# Patient Record
Sex: Female | Born: 1940 | Race: White | Hispanic: No | Smoking: Never smoker
Health system: Southern US, Community
[De-identification: ages and names within clinical notes are randomized; demographics above are authoritative.]

## PROBLEM LIST (undated history)

## (undated) DIAGNOSIS — T8859XA Other complications of anesthesia, initial encounter: Secondary | ICD-10-CM

## (undated) DIAGNOSIS — E7800 Pure hypercholesterolemia, unspecified: Secondary | ICD-10-CM

## (undated) DIAGNOSIS — M4800 Spinal stenosis, site unspecified: Secondary | ICD-10-CM

## (undated) DIAGNOSIS — M25561 Pain in right knee: Secondary | ICD-10-CM

## (undated) DIAGNOSIS — F329 Major depressive disorder, single episode, unspecified: Secondary | ICD-10-CM

## (undated) DIAGNOSIS — F32A Depression, unspecified: Secondary | ICD-10-CM

## (undated) DIAGNOSIS — L570 Actinic keratosis: Secondary | ICD-10-CM

## (undated) DIAGNOSIS — M1990 Unspecified osteoarthritis, unspecified site: Secondary | ICD-10-CM

## (undated) DIAGNOSIS — Z9189 Other specified personal risk factors, not elsewhere classified: Secondary | ICD-10-CM

## (undated) DIAGNOSIS — K219 Gastro-esophageal reflux disease without esophagitis: Secondary | ICD-10-CM

## (undated) HISTORY — DX: Actinic keratosis: L57.0

## (undated) HISTORY — PX: BREAST CYST ASPIRATION: SHX578

---
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
## 1978-09-25 HISTORY — PX: ABDOMINAL HYSTERECTOMY: SHX81

## 2016-10-03 DIAGNOSIS — E559 Vitamin D deficiency, unspecified: Secondary | ICD-10-CM | POA: Insufficient documentation

## 2016-10-13 DIAGNOSIS — M8589 Other specified disorders of bone density and structure, multiple sites: Secondary | ICD-10-CM | POA: Insufficient documentation

## 2016-10-16 ENCOUNTER — Other Ambulatory Visit: Payer: Self-pay | Admitting: Internal Medicine

## 2016-10-16 DIAGNOSIS — Z1231 Encounter for screening mammogram for malignant neoplasm of breast: Secondary | ICD-10-CM

## 2016-11-10 ENCOUNTER — Ambulatory Visit
Admission: RE | Admit: 2016-11-10 | Discharge: 2016-11-10 | Disposition: A | Discharge status: Home / Self Care | Payer: Medicare Other | Source: Ambulatory Visit | Attending: Internal Medicine | Admitting: Internal Medicine

## 2016-11-10 DIAGNOSIS — Z1231 Encounter for screening mammogram for malignant neoplasm of breast: Secondary | ICD-10-CM | POA: Insufficient documentation

## 2016-11-22 ENCOUNTER — Other Ambulatory Visit: Payer: Self-pay | Admitting: *Deleted

## 2016-11-22 ENCOUNTER — Inpatient Hospital Stay
Admission: RE | Admit: 2016-11-22 | Discharge: 2016-11-22 | Disposition: A | Discharge status: Home / Self Care | Payer: Self-pay | Source: Ambulatory Visit | Attending: *Deleted | Admitting: *Deleted

## 2016-11-22 DIAGNOSIS — Z9289 Personal history of other medical treatment: Secondary | ICD-10-CM

## 2018-06-24 ENCOUNTER — Other Ambulatory Visit: Payer: Self-pay | Admitting: Orthopedic Surgery

## 2018-06-24 DIAGNOSIS — G8929 Other chronic pain: Secondary | ICD-10-CM

## 2018-06-24 DIAGNOSIS — M5441 Lumbago with sciatica, right side: Principal | ICD-10-CM

## 2018-07-12 ENCOUNTER — Ambulatory Visit
Admission: RE | Admit: 2018-07-12 | Discharge: 2018-07-12 | Disposition: A | Discharge status: Home / Self Care | Payer: Medicare Other | Source: Ambulatory Visit | Attending: Orthopedic Surgery | Admitting: Orthopedic Surgery

## 2018-07-12 DIAGNOSIS — G8929 Other chronic pain: Secondary | ICD-10-CM

## 2018-07-12 DIAGNOSIS — M899 Disorder of bone, unspecified: Secondary | ICD-10-CM | POA: Insufficient documentation

## 2018-07-12 DIAGNOSIS — M48061 Spinal stenosis, lumbar region without neurogenic claudication: Secondary | ICD-10-CM | POA: Diagnosis not present

## 2018-07-12 DIAGNOSIS — M5441 Lumbago with sciatica, right side: Secondary | ICD-10-CM | POA: Diagnosis not present

## 2018-07-12 DIAGNOSIS — M4316 Spondylolisthesis, lumbar region: Secondary | ICD-10-CM | POA: Diagnosis not present

## 2018-07-25 ENCOUNTER — Other Ambulatory Visit: Payer: Self-pay | Admitting: Orthopedic Surgery

## 2018-07-25 DIAGNOSIS — G8929 Other chronic pain: Secondary | ICD-10-CM

## 2018-07-25 DIAGNOSIS — M5441 Lumbago with sciatica, right side: Principal | ICD-10-CM

## 2018-12-11 ENCOUNTER — Ambulatory Visit
Admission: RE | Admit: 2018-12-11 | Discharge: 2018-12-11 | Disposition: A | Discharge status: Home / Self Care | Payer: Medicare Other | Source: Ambulatory Visit | Attending: Orthopedic Surgery | Admitting: Orthopedic Surgery

## 2018-12-14 ENCOUNTER — Emergency Department: Payer: Medicare Other

## 2018-12-14 ENCOUNTER — Other Ambulatory Visit: Payer: Self-pay

## 2018-12-14 ENCOUNTER — Encounter: Payer: Self-pay | Admitting: Emergency Medicine

## 2018-12-14 ENCOUNTER — Emergency Department
Admission: EM | Admit: 2018-12-14 | Discharge: 2018-12-14 | Disposition: A | Discharge status: Home / Self Care | Payer: Medicare Other | Attending: Emergency Medicine | Admitting: Emergency Medicine

## 2018-12-14 DIAGNOSIS — Y9301 Activity, walking, marching and hiking: Secondary | ICD-10-CM | POA: Diagnosis not present

## 2018-12-14 DIAGNOSIS — Z7982 Long term (current) use of aspirin: Secondary | ICD-10-CM | POA: Insufficient documentation

## 2018-12-14 DIAGNOSIS — R42 Dizziness and giddiness: Secondary | ICD-10-CM | POA: Diagnosis not present

## 2018-12-14 DIAGNOSIS — S0083XA Contusion of other part of head, initial encounter: Secondary | ICD-10-CM

## 2018-12-14 DIAGNOSIS — Y998 Other external cause status: Secondary | ICD-10-CM | POA: Diagnosis not present

## 2018-12-14 DIAGNOSIS — W01198A Fall on same level from slipping, tripping and stumbling with subsequent striking against other object, initial encounter: Secondary | ICD-10-CM | POA: Insufficient documentation

## 2018-12-14 DIAGNOSIS — S0990XA Unspecified injury of head, initial encounter: Secondary | ICD-10-CM | POA: Diagnosis present

## 2018-12-14 DIAGNOSIS — Z79899 Other long term (current) drug therapy: Secondary | ICD-10-CM | POA: Diagnosis not present

## 2018-12-14 DIAGNOSIS — Y92480 Sidewalk as the place of occurrence of the external cause: Secondary | ICD-10-CM | POA: Diagnosis not present

## 2018-12-14 DIAGNOSIS — W19XXXA Unspecified fall, initial encounter: Secondary | ICD-10-CM

## 2018-12-14 NOTE — Discharge Instructions (Addendum)
Follow-up with Dr. Caryl Comes if you have any continued concerns.  Return emergency department worsening.

## 2018-12-14 NOTE — ED Notes (Signed)
See triage note  Presents s/p fall  States she tripped on uneven pavement yesterday.  Landed on right side  Having some generalized soreness  But bruising and swelling noted to right eye  Also this am she has had some nausea  No vomiting  But also states her head feels different  A/O on arrival

## 2018-12-14 NOTE — ED Triage Notes (Signed)
Pt to ed with c/o fall yesterday about 11 am.  Pt states she tripped on the sidewalk and then fell onto right side of face.  Pt with bruising to right eye.  Denies loss of consciousness.

## 2018-12-14 NOTE — ED Provider Notes (Signed)
[facility] Emergency Department Provider Note  ____________________________________________   First MD Initiated Contact with Patient 12/14/18 1157     (approximate)  I have reviewed the triage vital signs and the nursing notes.   HISTORY  Chief Complaint Fall    HPI Tara Griffin is a 78 y.o. female presents emergency department with her daughter-in-law.  Patient states she fell yesterday about 11 AM she tripped over a uneven sidewalk.  She fell directly on the right side of her face.  She did not lose consciousness at the time.  However she states she has felt a little dizzy and "woozy", she is also had some nausea, she denies any slurred speech but did find it difficult to read last night.  She denies any other injuries.    History reviewed. No pertinent past medical history.  There are no active problems to display for this patient.   Past Surgical History:  Procedure Laterality Date  . BREAST CYST ASPIRATION      Prior to Admission medications   Medication Sig Start Date End Date Taking? Authorizing Provider  aspirin EC 81 MG tablet Take 81 mg by mouth daily.   Yes [provider]  fluticasone (FLONASE) 50 MCG/ACT nasal spray Place 1 spray into both nostrils daily.   Yes [provider]  gabapentin (NEURONTIN) 100 MG capsule Take 100 mg by mouth at bedtime.   Yes [provider]  meloxicam (MOBIC) 15 MG tablet Take 15 mg by mouth daily.   Yes [provider]  omeprazole (PRILOSEC) 40 MG capsule Take 40 mg by mouth daily.   Yes [provider]  simvastatin (ZOCOR) 10 MG tablet Take 10 mg by mouth daily.   Yes [provider]    Allergies Patient has no known allergies.  History reviewed. No pertinent family history.  Social History Social History   Tobacco Use  . Smoking status: Never Smoker  . Smokeless tobacco: Never Used  Substance Use Topics  . Alcohol use: Yes    Frequency:  Never  . Drug use: Never    Review of Systems  Constitutional: No fever/chills Head: Positive for head injury Eyes: No visual changes. ENT: No sore throat. Respiratory: Denies cough Genitourinary: Negative for dysuria. Musculoskeletal: Negative for back pain. Skin: Negative for rash.    ____________________________________________   PHYSICAL EXAM:  VITAL SIGNS: ED Triage Vitals [12/14/18 1147]  Enc Vitals Group     BP (!) 165/77     Pulse Rate 96     Resp 16     Temp 98.3 F (36.8 C)     Temp Source Oral     SpO2 98 %     Weight 140 lb (63.5 kg)     Height      Head Circumference      Peak Flow      Pain Score 5     Pain Loc      Pain Edu?      Excl. in [place]?     Constitutional: Alert and oriented. Well appearing and in no acute distress. Eyes: Conjunctivae are normal.  Head: Large hematoma noted at the right zygomatic Nose: No congestion/rhinnorhea. Mouth/Throat: Mucous membranes are moist.   Neck:  supple no lymphadenopathy noted Cardiovascular: Normal rate, regular rhythm. Heart sounds are normal Respiratory: Normal respiratory effort.  No retractions, lungs c t a  GU: deferred Musculoskeletal: FROM all extremities, warm and well perfused, C-spine is mildly tender Neurologic:  Normal speech  and language.  Cranial nerves II through XII grossly intact Skin:  Skin is warm, dry and intact. No rash noted. Psychiatric: Mood and affect are normal. Speech and behavior are normal.  ____________________________________________   LABS (all labs ordered are listed, but only abnormal results are displayed)  Labs Reviewed - No data to display ____________________________________________   ____________________________________________  RADIOLOGY  CT the head, C-spine, and maxillofacial area are all negative for fractures or intracranial abnormality  ____________________________________________   PROCEDURES  Procedure(s) performed: No  Procedures     ____________________________________________   INITIAL IMPRESSION / ASSESSMENT AND PLAN / ED COURSE  Pertinent labs & imaging results that were available during my care of the patient were reviewed by me and considered in my medical decision making (see chart for details).   Patient 78 year old female presents emergency department after a fall yesterday.  Physical exam shows a large hematoma on the right zygomatic.  C-spine spinal tenderness.  Due to the patient and daughter-in-law's concerns of concussion versus head bleed a CT of the head was ordered.  Due to the swelling on the right side of face CT maxillofacial along with C-spine were ordered.  CT of the head, maxillofacial, and C-spine are all negative.  Explained findings to the patient and her daughter-in-law.  Explained her that she might have a mild concussion.  Concussion instructions were given to the patient.  She is to take Tylenol and ibuprofen for pain if needed.  Return to emergency department if worsening.  She states she understands will comply.  She was discharged in stable condition.     As part of my medical decision making, I reviewed the following data within the [place] History obtained from family, Nursing notes reviewed and incorporated, Old chart reviewed, Radiograph reviewed CT of the head, C-spine, and maxillofacial are negative, Notes from prior ED visits and  Controlled Substance Database  ____________________________________________   FINAL CLINICAL IMPRESSION(S) / ED DIAGNOSES  Final diagnoses:  Minor head injury, initial encounter  Fall, initial encounter  Contusion of face, initial encounter      NEW MEDICATIONS STARTED DURING THIS VISIT:  Discharge Medication List as of 12/14/2018  1:47 PM       Note:  This document was prepared using Dragon voice recognition software and may include unintentional dictation errors.    Versie Starks, PA-C 12/14/18 1431     Schuyler Amor, MD 12/15/18 [phone]

## 2019-01-27 DIAGNOSIS — F321 Major depressive disorder, single episode, moderate: Secondary | ICD-10-CM | POA: Insufficient documentation

## 2019-02-24 ENCOUNTER — Ambulatory Visit
Admission: RE | Admit: 2019-02-24 | Discharge: 2019-02-24 | Disposition: A | Discharge status: Home / Self Care | Payer: Medicare Other | Source: Ambulatory Visit | Attending: Orthopedic Surgery | Admitting: Orthopedic Surgery

## 2019-02-24 ENCOUNTER — Other Ambulatory Visit: Payer: Self-pay

## 2019-02-24 DIAGNOSIS — M5441 Lumbago with sciatica, right side: Secondary | ICD-10-CM | POA: Diagnosis present

## 2019-02-24 DIAGNOSIS — G8929 Other chronic pain: Secondary | ICD-10-CM | POA: Diagnosis present

## 2019-02-24 LAB — POCT I-STAT CREATININE: Creatinine, Ser: 0.7 mg/dL (ref 0.44–1.00)

## 2019-02-24 MED ORDER — GADOBUTROL 1 MMOL/ML IV SOLN
6.0000 mL | Freq: Once | INTRAVENOUS | Status: AC | PRN
  Administered 2019-02-24: 6 mL via INTRAVENOUS

## 2019-04-30 NOTE — Discharge Instructions (Signed)
General Anesthesia, Adult, Care After °This sheet gives you information about how to care for yourself after your procedure. Your health care provider may also give you more specific instructions. If you have problems or questions, contact your health care provider. °What can I expect after the procedure? °After the procedure, the following side effects are common: °· Pain or discomfort at the IV site. °· Nausea. °· Vomiting. °· Sore throat. °· Trouble concentrating. °· Feeling cold or chills. °· Weak or tired. °· Sleepiness and fatigue. °· Soreness and body aches. These side effects can affect parts of the body that were not involved in surgery. °Follow these instructions at home: ° °For at least 24 hours after the procedure: °· Have a responsible adult stay with you. It is important to have someone help care for you until you are awake and alert. °· Rest as needed. °· Do not: °? Participate in activities in which you could fall or become injured. °? Drive. °? Use heavy machinery. °? Drink alcohol. °? Take sleeping pills or medicines that cause drowsiness. °? Make important decisions or sign legal documents. °? Take care of children on your own. °Eating and drinking °· Follow any instructions from your health care provider about eating or drinking restrictions. °· When you feel hungry, start by eating small amounts of foods that are soft and easy to digest (bland), such as toast. Gradually return to your regular diet. °· Drink enough fluid to keep your urine pale yellow. °· If you vomit, rehydrate by drinking water, juice, or clear broth. °General instructions °· If you have sleep apnea, surgery and certain medicines can increase your risk for breathing problems. Follow instructions from your health care provider about wearing your sleep device: °? Anytime you are sleeping, including during daytime naps. °? While taking prescription pain medicines, sleeping medicines, or medicines that make you drowsy. °· Return to  your normal activities as told by your health care provider. Ask your health care provider what activities are safe for you. °· Take over-the-counter and prescription medicines only as told by your health care provider. °· If you smoke, do not smoke without supervision. °· Keep all follow-up visits as told by your health care provider. This is important. °Contact a health care provider if: °· You have nausea or vomiting that does not get better with medicine. °· You cannot eat or drink without vomiting. °· You have pain that does not get better with medicine. °· You are unable to pass urine. °· You develop a skin rash. °· You have a fever. °· You have redness around your IV site that gets worse. °Get help right away if: °· You have difficulty breathing. °· You have chest pain. °· You have blood in your urine or stool, or you vomit blood. °Summary °· After the procedure, it is common to have a sore throat or nausea. It is also common to feel tired. °· Have a responsible adult stay with you for the first 24 hours after general anesthesia. It is important to have someone help care for you until you are awake and alert. °· When you feel hungry, start by eating small amounts of foods that are soft and easy to digest (bland), such as toast. Gradually return to your regular diet. °· Drink enough fluid to keep your urine pale yellow. °· Return to your normal activities as told by your health care provider. Ask your health care provider what activities are safe for you. °This information is not   intended to replace advice given to you by your health care provider. Make sure you discuss any questions you have with your health care provider. °Document Released: 12/18/2000 Document Revised: 09/14/2017 Document Reviewed: 04/27/2017 °Elsevier Patient Education © 2020 Elsevier Inc[employer] °Cataract Surgery, Care After °This sheet gives you information about how to care for yourself after your procedure. Your health care provider may also  give you more specific instructions. If you have problems or questions, contact your health care provider. °What can I expect after the procedure? °After the procedure, it is common to have: °· Itching. °· Discomfort. °· Fluid discharge. °· Sensitivity to light and to touch. °· Bruising in or around the eye. °· Mild blurred vision. °Follow these instructions at home: °Eye care ° °· Do not touch or rub your eyes. °· Protect your eyes as told by your health care provider. You may be told to wear a protective eye shield or sunglasses. °· Do not put a contact lens into the affected eye or eyes until your health care provider approves. °· Keep the area around your eye clean and dry: °? Avoid swimming. °? Do not allow water to hit you directly in the face while showering. °? Keep soap and shampoo out of your eyes. °· Check your eye every day for signs of infection. Watch for: °? Redness, swelling, or pain. °? Fluid, blood, or pus. °? Warmth. °? A bad smell. °? Vision that is getting worse. °? Sensitivity that is getting worse. °Activity °· Do not drive for 24 hours if you were given a sedative during your procedure. °· Avoid strenuous activities, such as playing contact sports, for as long as told by your health care provider. °· Do not drive or use heavy machinery until your health care provider approves. °· Do not bend or lift heavy objects. Bending increases pressure in the eye. You can walk, climb stairs, and do light household chores. °· Ask your health care provider when you can return to work. If you work in a dusty environment, you may be advised to wear protective eyewear for a period of time. °General instructions °· Take or apply over-the-counter and prescription medicines only as told by your health care provider. This includes eye drops. °· Keep all follow-up visits as told by your health care provider. This is important. °Contact a health care provider if: °· You have increased bruising around your  eye. °· You have pain that is not helped with medicine. °· You have a fever. °· You have redness, swelling, or pain in your eye. °· You have fluid, blood, or pus coming from your incision. °· Your vision gets worse. °· Your sensitivity to light gets worse. °Get help right away if: °· You have sudden loss of vision. °· You see flashes of light or spots (floaters). °· You have severe eye pain. °· You develop nausea or vomiting. °Summary °· After your procedure, it is common to have itching, discomfort, bruising, fluid discharge, or sensitivity to light. °· Follow instructions from your health care provider about caring for your eye after the procedure. °· Do not rub your eye after the procedure. You may need to wear eye protection or sunglasses. Do not wear contact lenses. Keep the area around your eye clean and dry. °· Avoid activities that require a lot of effort. These include playing sports and lifting heavy objects. °· Contact a health care provider if you have increased bruising, pain that does not go away, or a fever. Get   help right away if you suddenly lose your vision, see flashes of light or spots, or have severe pain in the eye. °This information is not intended to replace advice given to you by your health care provider. Make sure you discuss any questions you have with your health care provider. °Document Released: 03/31/2005 Document Revised: 03/11/2018 Document Reviewed: 03/11/2018 °Elsevier Patient Education © 2020 Elsevier Inc. ° °

## 2019-05-01 ENCOUNTER — Other Ambulatory Visit
Admission: RE | Admit: 2019-05-01 | Discharge: 2019-05-01 | Disposition: A | Discharge status: Home / Self Care | Payer: Medicare Other | Source: Ambulatory Visit | Attending: Ophthalmology | Admitting: Ophthalmology

## 2019-05-01 ENCOUNTER — Other Ambulatory Visit: Payer: Self-pay

## 2019-05-01 DIAGNOSIS — Z01812 Encounter for preprocedural laboratory examination: Secondary | ICD-10-CM | POA: Diagnosis present

## 2019-05-01 DIAGNOSIS — Z20828 Contact with and (suspected) exposure to other viral communicable diseases: Secondary | ICD-10-CM | POA: Diagnosis not present

## 2019-05-01 LAB — SARS CORONAVIRUS 2 (TAT 6-24 HRS): SARS Coronavirus 2: NEGATIVE

## 2019-05-02 ENCOUNTER — Other Ambulatory Visit: Admission: RE | Admit: 2019-05-02 | Payer: Medicare Other | Source: Ambulatory Visit

## 2019-05-06 ENCOUNTER — Encounter
Admission: RE | Disposition: A | Discharge status: Home / Self Care | Payer: Self-pay | Source: Ambulatory Visit | Attending: Ophthalmology

## 2019-05-06 ENCOUNTER — Ambulatory Visit: Payer: Medicare Other | Admitting: Anesthesiology

## 2019-05-06 ENCOUNTER — Other Ambulatory Visit: Payer: Self-pay

## 2019-05-06 ENCOUNTER — Ambulatory Visit
Admission: RE | Admit: 2019-05-06 | Discharge: 2019-05-06 | Disposition: A | Discharge status: Home / Self Care | Payer: Medicare Other | Source: Ambulatory Visit | Attending: Ophthalmology | Admitting: Ophthalmology

## 2019-05-06 DIAGNOSIS — E7800 Pure hypercholesterolemia, unspecified: Secondary | ICD-10-CM | POA: Insufficient documentation

## 2019-05-06 DIAGNOSIS — H2511 Age-related nuclear cataract, right eye: Secondary | ICD-10-CM | POA: Diagnosis not present

## 2019-05-06 DIAGNOSIS — E785 Hyperlipidemia, unspecified: Secondary | ICD-10-CM | POA: Diagnosis not present

## 2019-05-06 DIAGNOSIS — K219 Gastro-esophageal reflux disease without esophagitis: Secondary | ICD-10-CM | POA: Insufficient documentation

## 2019-05-06 DIAGNOSIS — F329 Major depressive disorder, single episode, unspecified: Secondary | ICD-10-CM | POA: Diagnosis not present

## 2019-05-06 DIAGNOSIS — Z79899 Other long term (current) drug therapy: Secondary | ICD-10-CM | POA: Insufficient documentation

## 2019-05-06 DIAGNOSIS — Z87891 Personal history of nicotine dependence: Secondary | ICD-10-CM | POA: Diagnosis not present

## 2019-05-06 HISTORY — DX: Gastro-esophageal reflux disease without esophagitis: K21.9

## 2019-05-06 HISTORY — DX: Pain in right knee: M25.561

## 2019-05-06 HISTORY — DX: Unspecified osteoarthritis, unspecified site: M19.90

## 2019-05-06 HISTORY — DX: Pure hypercholesterolemia, unspecified: E78.00

## 2019-05-06 HISTORY — PX: CATARACT EXTRACTION W/PHACO: SHX586

## 2019-05-06 HISTORY — DX: Depression, unspecified: F32.A

## 2019-05-06 HISTORY — DX: Spinal stenosis, site unspecified: M48.00

## 2019-05-06 SURGERY — PHACOEMULSIFICATION, CATARACT, WITH IOL INSERTION
Anesthesia: Monitor Anesthesia Care | Site: Eye | Laterality: Right

## 2019-05-06 MED ORDER — LIDOCAINE HCL (PF) 2 % IJ SOLN
INTRAOCULAR | Status: DC | PRN
  Administered 2019-05-06: 1 mL

## 2019-05-06 MED ORDER — LACTATED RINGERS IV SOLN: INTRAVENOUS | Status: DC

## 2019-05-06 MED ORDER — MIDAZOLAM HCL 2 MG/2ML IJ SOLN
INTRAMUSCULAR | Status: DC | PRN
  Administered 2019-05-06: 1 mg via INTRAVENOUS

## 2019-05-06 MED ORDER — FENTANYL CITRATE (PF) 100 MCG/2ML IJ SOLN
INTRAMUSCULAR | Status: DC | PRN
  Administered 2019-05-06: 50 ug via INTRAVENOUS

## 2019-05-06 MED ORDER — ARMC OPHTHALMIC DILATING DROPS
1.0000 "application " | OPHTHALMIC | Status: DC | PRN
  Administered 2019-05-06 (×3): 1 via OPHTHALMIC

## 2019-05-06 MED ORDER — BRIMONIDINE TARTRATE-TIMOLOL 0.2-0.5 % OP SOLN
OPHTHALMIC | Status: DC | PRN
  Administered 2019-05-06: 1 [drp] via OPHTHALMIC

## 2019-05-06 MED ORDER — MOXIFLOXACIN HCL 0.5 % OP SOLN
OPHTHALMIC | Status: DC | PRN
  Administered 2019-05-06: 0.2 mL via OPHTHALMIC

## 2019-05-06 MED ORDER — EPINEPHRINE PF 1 MG/ML IJ SOLN
INTRAOCULAR | Status: DC | PRN
  Administered 2019-05-06: 10:00:00 94 mL via OPHTHALMIC

## 2019-05-06 MED ORDER — TETRACAINE HCL 0.5 % OP SOLN
1.0000 [drp] | OPHTHALMIC | Status: DC | PRN
  Administered 2019-05-06 (×3): 1 [drp] via OPHTHALMIC

## 2019-05-06 MED ORDER — NA CHONDROIT SULF-NA HYALURON 40-17 MG/ML IO SOLN
INTRAOCULAR | Status: DC | PRN
  Administered 2019-05-06: 1 mL via INTRAOCULAR

## 2019-05-06 SURGICAL SUPPLY — 18 items
CANNULA ANT/CHMB 27GA (MISCELLANEOUS) ×3 IMPLANT
GLOVE SURG LX 8.0 MICRO (GLOVE) ×2
GLOVE SURG LX STRL 8.0 MICRO (GLOVE) ×1 IMPLANT
GLOVE SURG TRIUMPH 8.0 PF LTX (GLOVE) ×3 IMPLANT
GOWN STRL REUS W/ TWL LRG LVL3 (GOWN DISPOSABLE) ×2 IMPLANT
GOWN STRL REUS W/TWL LRG LVL3 (GOWN DISPOSABLE) ×4
LENS IOL TECNIS ITEC 19.5 (Intraocular Lens) ×3 IMPLANT
MARKER SKIN DUAL TIP RULER LAB (MISCELLANEOUS) ×3 IMPLANT
NDL RETROBULBAR .5 NSTRL (NEEDLE) ×3 IMPLANT
NEEDLE FILTER BLUNT 18X 1/2SAF (NEEDLE) ×2
NEEDLE FILTER BLUNT 18X1 1/2 (NEEDLE) ×1 IMPLANT
PACK EYE AFTER SURG (MISCELLANEOUS) ×3 IMPLANT
PACK OPTHALMIC (MISCELLANEOUS) ×3 IMPLANT
PACK PORFILIO (MISCELLANEOUS) ×3 IMPLANT
SYR 3ML LL SCALE MARK (SYRINGE) ×3 IMPLANT
SYR TB 1ML LUER SLIP (SYRINGE) ×3 IMPLANT
WATER STERILE IRR 250ML POUR (IV SOLUTION) ×3 IMPLANT
WIPE NON LINTING 3.25X3.25 (MISCELLANEOUS) ×3 IMPLANT

## 2019-05-06 NOTE — H&P (Signed)
All labs reviewed. Abnormal studies sent to patients PCP when indicated.  Previous H&P reviewed, patient examined, there are NO CHANGES.  Tara Griffin Porfilio8/11/20209:42 AM

## 2019-05-06 NOTE — Op Note (Signed)
PREOPERATIVE DIAGNOSIS:  Nuclear sclerotic cataract of the right eye.   POSTOPERATIVE DIAGNOSIS:  H25.11  CATARACT   OPERATIVE PROCEDURE: Procedure(s): CATARACT EXTRACTION PHACO AND INTRAOCULAR LENS PLACEMENT (IOC) RIGHT   SURGEON:  Birder Robson, MD.   ANESTHESIA:  Anesthesiologist: Veda Canning, MD CRNA: Cameron Ali, CRNA  1.      Managed anesthesia care. 2.      0.70ml of Shugarcaine was instilled in the eye following the paracentesis.   COMPLICATIONS:  None.   TECHNIQUE:   Stop and chop   DESCRIPTION OF PROCEDURE:  The patient was examined and consented in the preoperative holding area where the aforementioned topical anesthesia was applied to the right eye and then brought back to the Operating Room where the right eye was prepped and draped in the usual sterile ophthalmic fashion and a lid speculum was placed. A paracentesis was created with the side port blade and the anterior chamber was filled with viscoelastic. A near clear corneal incision was performed with the steel keratome. A continuous curvilinear capsulorrhexis was performed with a cystotome followed by the capsulorrhexis forceps. Hydrodissection and hydrodelineation were carried out with BSS on a blunt cannula. The lens was removed in a stop and chop  technique and the remaining cortical material was removed with the irrigation-aspiration handpiece. The capsular bag was inflated with viscoelastic and the Technis [id number]  lens was placed in the capsular bag without complication. The remaining viscoelastic was removed from the eye with the irrigation-aspiration handpiece. The wounds were hydrated. The anterior chamber was flushed with BSS and the eye was inflated to physiologic pressure. 0.38ml of Vigamox was placed in the anterior chamber. The wounds were found to be water tight. The eye was dressed with [place]. The patient was given protective glasses to wear throughout the day and a shield with which to sleep tonight. The  patient was also given drops with which to begin a drop regimen today and will follow-up with me in one day. Implant Name Type Inv. Item Serial No. Manufacturer Lot No. LRB No. Used Action  LENS IOL DIOP 19.5 - [id number] Intraocular Lens LENS IOL DIOP 19.5 [id number] AMO  Right 1 Implanted   Procedure(s): CATARACT EXTRACTION PHACO AND INTRAOCULAR LENS PLACEMENT (IOC) RIGHT (Right)  Electronically signed: Birder Robson 05/06/2019 10:10 AM

## 2019-05-06 NOTE — Anesthesia Preprocedure Evaluation (Signed)
Anesthesia Evaluation  Patient identified by MRN, date of birth, ID band Patient awake    Reviewed: Allergy & Precautions, NPO status , Patient's Chart, lab work & pertinent test results  Airway Mallampati: II  TM Distance: >3 FB     Dental   Pulmonary    breath sounds clear to auscultation       Cardiovascular  Rhythm:Regular Rate:Normal  HLD   Neuro/Psych Depression    GI/Hepatic GERD  ,  Endo/Other    Renal/GU      Musculoskeletal  (+) Arthritis ,   Abdominal   Peds  Hematology   Anesthesia Other Findings   Reproductive/Obstetrics                             Anesthesia Physical Anesthesia Plan  ASA: II  Anesthesia Plan: MAC   Post-op Pain Management:    Induction:   PONV Risk Score and Plan:   Airway Management Planned: Natural Airway and Nasal Cannula  Additional Equipment:   Intra-op Plan:   Post-operative Plan:   Informed Consent: I have reviewed the patients History and Physical, chart, labs and discussed the procedure including the risks, benefits and alternatives for the proposed anesthesia with the patient or authorized representative who has indicated his/her understanding and acceptance.       Plan Discussed with: CRNA  Anesthesia Plan Comments:         Anesthesia Quick Evaluation

## 2019-05-06 NOTE — Anesthesia Postprocedure Evaluation (Signed)
Anesthesia Post Note  Patient: Tara Griffin  Procedure(s) Performed: CATARACT EXTRACTION PHACO AND INTRAOCULAR LENS PLACEMENT (IOC) RIGHT (Right Eye)  Patient location during evaluation: PACU Anesthesia Type: MAC Level of consciousness: awake and alert Pain management: pain level controlled Vital Signs Assessment: post-procedure vital signs reviewed and stable Respiratory status: spontaneous breathing, nonlabored ventilation, respiratory function stable and patient connected to nasal cannula oxygen Cardiovascular status: stable and blood pressure returned to baseline Postop Assessment: no apparent nausea or vomiting Anesthetic complications: no    Veda Canning

## 2019-05-06 NOTE — Transfer of Care (Signed)
Immediate Anesthesia Transfer of Care Note  Patient: Tara Griffin  Procedure(s) Performed: CATARACT EXTRACTION PHACO AND INTRAOCULAR LENS PLACEMENT (IOC) RIGHT (Right Eye)  Patient Location: PACU  Anesthesia Type: MAC  Level of Consciousness: awake, alert  and patient cooperative  Airway and Oxygen Therapy: Patient Spontanous Breathing and Patient connected to supplemental oxygen  Post-op Assessment: Post-op Vital signs reviewed, Patient's Cardiovascular Status Stable, Respiratory Function Stable, Patent Airway and No signs of Nausea or vomiting  Post-op Vital Signs: Reviewed and stable  Complications: No apparent anesthesia complications

## 2019-05-06 NOTE — Anesthesia Procedure Notes (Signed)
Procedure Name: MAC Date/Time: 05/06/2019 9:50 AM Performed by: Cameron Ali, CRNA Pre-anesthesia Checklist: Patient identified, Emergency Drugs available, Suction available, Timeout performed and Patient being monitored Patient Re-evaluated:Patient Re-evaluated prior to induction Oxygen Delivery Method: Nasal cannula Placement Confirmation: positive ETCO2

## 2019-05-07 ENCOUNTER — Encounter: Payer: Self-pay | Admitting: Ophthalmology

## 2019-05-20 ENCOUNTER — Other Ambulatory Visit: Payer: Self-pay

## 2019-05-20 ENCOUNTER — Encounter: Payer: Self-pay | Admitting: *Deleted

## 2019-05-23 ENCOUNTER — Other Ambulatory Visit
Admission: RE | Admit: 2019-05-23 | Discharge: 2019-05-23 | Disposition: A | Discharge status: Home / Self Care | Payer: Medicare Other | Source: Ambulatory Visit | Attending: Ophthalmology | Admitting: Ophthalmology

## 2019-05-23 ENCOUNTER — Other Ambulatory Visit: Payer: Self-pay

## 2019-05-23 DIAGNOSIS — Z01812 Encounter for preprocedural laboratory examination: Secondary | ICD-10-CM | POA: Insufficient documentation

## 2019-05-23 DIAGNOSIS — Z20828 Contact with and (suspected) exposure to other viral communicable diseases: Secondary | ICD-10-CM | POA: Insufficient documentation

## 2019-05-23 LAB — SARS CORONAVIRUS 2 (TAT 6-24 HRS): SARS Coronavirus 2: NEGATIVE

## 2019-05-26 NOTE — Discharge Instructions (Signed)

## 2019-05-27 ENCOUNTER — Encounter
Admission: RE | Disposition: A | Discharge status: Home / Self Care | Payer: Self-pay | Source: Ambulatory Visit | Attending: Ophthalmology

## 2019-05-27 ENCOUNTER — Ambulatory Visit: Payer: Medicare Other | Admitting: Anesthesiology

## 2019-05-27 ENCOUNTER — Ambulatory Visit
Admission: RE | Admit: 2019-05-27 | Discharge: 2019-05-27 | Disposition: A | Discharge status: Home / Self Care | Payer: Medicare Other | Source: Ambulatory Visit | Attending: Ophthalmology | Admitting: Ophthalmology

## 2019-05-27 DIAGNOSIS — Z79899 Other long term (current) drug therapy: Secondary | ICD-10-CM | POA: Insufficient documentation

## 2019-05-27 DIAGNOSIS — H2512 Age-related nuclear cataract, left eye: Secondary | ICD-10-CM | POA: Insufficient documentation

## 2019-05-27 DIAGNOSIS — Z87891 Personal history of nicotine dependence: Secondary | ICD-10-CM | POA: Diagnosis not present

## 2019-05-27 DIAGNOSIS — E785 Hyperlipidemia, unspecified: Secondary | ICD-10-CM | POA: Diagnosis not present

## 2019-05-27 DIAGNOSIS — F329 Major depressive disorder, single episode, unspecified: Secondary | ICD-10-CM | POA: Diagnosis not present

## 2019-05-27 DIAGNOSIS — Z791 Long term (current) use of non-steroidal anti-inflammatories (NSAID): Secondary | ICD-10-CM | POA: Insufficient documentation

## 2019-05-27 DIAGNOSIS — E7800 Pure hypercholesterolemia, unspecified: Secondary | ICD-10-CM | POA: Insufficient documentation

## 2019-05-27 DIAGNOSIS — M4800 Spinal stenosis, site unspecified: Secondary | ICD-10-CM | POA: Insufficient documentation

## 2019-05-27 DIAGNOSIS — K219 Gastro-esophageal reflux disease without esophagitis: Secondary | ICD-10-CM | POA: Insufficient documentation

## 2019-05-27 DIAGNOSIS — M1990 Unspecified osteoarthritis, unspecified site: Secondary | ICD-10-CM | POA: Diagnosis not present

## 2019-05-27 DIAGNOSIS — Z7982 Long term (current) use of aspirin: Secondary | ICD-10-CM | POA: Diagnosis not present

## 2019-05-27 HISTORY — PX: CATARACT EXTRACTION W/PHACO: SHX586

## 2019-05-27 SURGERY — PHACOEMULSIFICATION, CATARACT, WITH IOL INSERTION
Anesthesia: Monitor Anesthesia Care | Site: Eye | Laterality: Left

## 2019-05-27 MED ORDER — MOXIFLOXACIN HCL 0.5 % OP SOLN
OPHTHALMIC | Status: DC | PRN
  Administered 2019-05-27: 0.2 mL via OPHTHALMIC

## 2019-05-27 MED ORDER — LIDOCAINE HCL (PF) 2 % IJ SOLN
INTRAOCULAR | Status: DC | PRN
  Administered 2019-05-27: 1 mL

## 2019-05-27 MED ORDER — LACTATED RINGERS IV SOLN: 100.0000 mL/h | INTRAVENOUS | Status: DC

## 2019-05-27 MED ORDER — EPINEPHRINE PF 1 MG/ML IJ SOLN
INTRAOCULAR | Status: DC | PRN
  Administered 2019-05-27: 63 mL via OPHTHALMIC

## 2019-05-27 MED ORDER — TETRACAINE HCL 0.5 % OP SOLN
1.0000 [drp] | OPHTHALMIC | Status: DC | PRN
  Administered 2019-05-27 (×3): 1 [drp] via OPHTHALMIC

## 2019-05-27 MED ORDER — MIDAZOLAM HCL 2 MG/2ML IJ SOLN
INTRAMUSCULAR | Status: DC | PRN
  Administered 2019-05-27: 1 mg via INTRAVENOUS

## 2019-05-27 MED ORDER — FENTANYL CITRATE (PF) 100 MCG/2ML IJ SOLN
INTRAMUSCULAR | Status: DC | PRN
  Administered 2019-05-27: 50 ug via INTRAVENOUS

## 2019-05-27 MED ORDER — ARMC OPHTHALMIC DILATING DROPS
1.0000 "application " | OPHTHALMIC | Status: DC | PRN
  Administered 2019-05-27 (×3): 1 via OPHTHALMIC

## 2019-05-27 MED ORDER — NA CHONDROIT SULF-NA HYALURON 40-17 MG/ML IO SOLN
INTRAOCULAR | Status: DC | PRN
  Administered 2019-05-27: 1 mL via INTRAOCULAR

## 2019-05-27 MED ORDER — BRIMONIDINE TARTRATE-TIMOLOL 0.2-0.5 % OP SOLN
OPHTHALMIC | Status: DC | PRN
  Administered 2019-05-27: 1 [drp] via OPHTHALMIC

## 2019-05-27 SURGICAL SUPPLY — 18 items
CANNULA ANT/CHMB 27GA (MISCELLANEOUS) ×6 IMPLANT
GLOVE SURG LX 8.0 MICRO (GLOVE) ×2
GLOVE SURG LX STRL 8.0 MICRO (GLOVE) ×1 IMPLANT
GLOVE SURG TRIUMPH 8.0 PF LTX (GLOVE) ×3 IMPLANT
GOWN STRL REUS W/ TWL LRG LVL3 (GOWN DISPOSABLE) ×2 IMPLANT
GOWN STRL REUS W/TWL LRG LVL3 (GOWN DISPOSABLE) ×4
LENS IOL TECNIS ITEC 20.0 (Intraocular Lens) ×3 IMPLANT
MARKER SKIN DUAL TIP RULER LAB (MISCELLANEOUS) ×3 IMPLANT
NDL RETROBULBAR .5 NSTRL (NEEDLE) ×3 IMPLANT
NEEDLE FILTER BLUNT 18X 1/2SAF (NEEDLE) ×2
NEEDLE FILTER BLUNT 18X1 1/2 (NEEDLE) ×1 IMPLANT
PACK EYE AFTER SURG (MISCELLANEOUS) ×3 IMPLANT
PACK OPTHALMIC (MISCELLANEOUS) ×3 IMPLANT
PACK PORFILIO (MISCELLANEOUS) ×3 IMPLANT
SYR 3ML LL SCALE MARK (SYRINGE) ×3 IMPLANT
SYR TB 1ML LUER SLIP (SYRINGE) ×3 IMPLANT
WATER STERILE IRR 250ML POUR (IV SOLUTION) ×3 IMPLANT
WIPE NON LINTING 3.25X3.25 (MISCELLANEOUS) ×3 IMPLANT

## 2019-05-27 NOTE — Anesthesia Postprocedure Evaluation (Signed)
Anesthesia Post Note  Patient: Tara Griffin  Procedure(s) Performed: CATARACT EXTRACTION PHACO AND INTRAOCULAR LENS PLACEMENT (IOC) LEFT  00:56.3  18.2%  10.24 (Left Eye)  Patient location during evaluation: PACU Anesthesia Type: MAC Level of consciousness: awake and alert Pain management: pain level controlled Vital Signs Assessment: post-procedure vital signs reviewed and stable Respiratory status: spontaneous breathing, nonlabored ventilation, respiratory function stable and patient connected to nasal cannula oxygen Cardiovascular status: stable and blood pressure returned to baseline Postop Assessment: no apparent nausea or vomiting Anesthetic complications: no    Raahim Shartzer A  Salif Tay

## 2019-05-27 NOTE — H&P (Signed)
All labs reviewed. Abnormal studies sent to patients PCP when indicated.  Previous H&P reviewed, patient examined, there are NO CHANGES.  Tara Hisaw Porfilio9/1/202012:49 PM

## 2019-05-27 NOTE — Anesthesia Preprocedure Evaluation (Addendum)
Anesthesia Evaluation  Patient identified by MRN, date of birth, ID band Patient awake    Reviewed: Allergy & Precautions, NPO status , Patient's Chart, lab work & pertinent test results  History of Anesthesia Complications Negative for: history of anesthetic complications  Airway Mallampati: II  TM Distance: >3 FB Neck ROM: Full    Dental  (+)    Pulmonary    breath sounds clear to auscultation       Cardiovascular (-) angina(-) DOE  Rhythm:Regular Rate:Normal     Neuro/Psych PSYCHIATRIC DISORDERS Depression  Neuromuscular disease (Spinal stenosis)    GI/Hepatic GERD  Medicated and Controlled,  Endo/Other    Renal/GU      Musculoskeletal  (+) Arthritis ,   Abdominal   Peds  Hematology   Anesthesia Other Findings HLD  Reproductive/Obstetrics                            Anesthesia Physical Anesthesia Plan  ASA: III  Anesthesia Plan: MAC   Post-op Pain Management:    Induction: Intravenous  PONV Risk Score and Plan: 2 and TIVA and Midazolam  Airway Management Planned: Nasal Cannula  Additional Equipment:   Intra-op Plan:   Post-operative Plan:   Informed Consent: I have reviewed the patients History and Physical, chart, labs and discussed the procedure including the risks, benefits and alternatives for the proposed anesthesia with the patient or authorized representative who has indicated his/her understanding and acceptance.       Plan Discussed with: CRNA and Anesthesiologist  Anesthesia Plan Comments:         Anesthesia Quick Evaluation   Active Ambulatory Problems    Diagnosis Date Noted  . No Active Ambulatory Problems   Resolved Ambulatory Problems    Diagnosis Date Noted  . No Resolved Ambulatory Problems   Past Medical History:  Diagnosis Date  . Arthritis   . Depression   . GERD (gastroesophageal reflux disease)   . Hypercholesteremia   .  Right knee pain   . Spinal stenosis     CBC No results found for: WBC, RBC, HGB, HCT, PLT, MCV, MCH, MCHC, RDW, LYMPHSABS, MONOABS, EOSABS, BASOSABS  CMP     Component Value Date/Time   CREATININE 0.70 02/24/2019 1057    COAGS No results found for: INR, PTT  I have seen and consented the patient, Constancia Geeting. I have answered all of her questions regarding anesthesia. she is appropriately NPO.   Josephina Shih, MD Anesthesia

## 2019-05-27 NOTE — Anesthesia Procedure Notes (Signed)
Procedure Name: MAC Date/Time: 05/27/2019 12:54 PM Performed by: Cameron Ali, CRNA Pre-anesthesia Checklist: Patient identified, Emergency Drugs available, Suction available, Timeout performed and Patient being monitored Patient Re-evaluated:Patient Re-evaluated prior to induction Oxygen Delivery Method: Nasal cannula Placement Confirmation: positive ETCO2

## 2019-05-27 NOTE — Op Note (Signed)
PREOPERATIVE DIAGNOSIS:  Nuclear sclerotic cataract of the left eye.   POSTOPERATIVE DIAGNOSIS:  Nuclear sclerotic cataract of the left eye.   OPERATIVE PROCEDURE: Procedure(s): CATARACT EXTRACTION PHACO AND INTRAOCULAR LENS PLACEMENT (IOC) LEFT  00:56.3  18.2%  10.24   SURGEON:  Birder Robson, MD.   ANESTHESIA:  Anesthesiologist: Heniser, Fredric Dine, MD CRNA: Cameron Ali, CRNA  1.      Managed anesthesia care. 2.     0.63ml of Shugarcaine was instilled following the paracentesis   COMPLICATIONS:  None.   TECHNIQUE:   Stop and chop   DESCRIPTION OF PROCEDURE:  The patient was examined and consented in the preoperative holding area where the aforementioned topical anesthesia was applied to the left eye and then brought back to the Operating Room where the left eye was prepped and draped in the usual sterile ophthalmic fashion and a lid speculum was placed. A paracentesis was created with the side port blade and the anterior chamber was filled with viscoelastic. A near clear corneal incision was performed with the steel keratome. A continuous curvilinear capsulorrhexis was performed with a cystotome followed by the capsulorrhexis forceps. Hydrodissection and hydrodelineation were carried out with BSS on a blunt cannula. The lens was removed in a stop and chop  technique and the remaining cortical material was removed with the irrigation-aspiration handpiece. The capsular bag was inflated with viscoelastic and the Technis [id number] lens was placed in the capsular bag without complication. The remaining viscoelastic was removed from the eye with the irrigation-aspiration handpiece. The wounds were hydrated. The anterior chamber was flushed with BSS and the eye was inflated to physiologic pressure. 0.1ml Vigamox was placed in the anterior chamber. The wounds were found to be water tight. The eye was dressed with Combigan. The patient was given protective glasses to wear throughout the day and a shield  with which to sleep tonight. The patient was also given drops with which to begin a drop regimen today and will follow-up with me in one day. Implant Name Type Inv. Item Serial No. Manufacturer Lot No. LRB No. Used Action  LENS IOL DIOP 20.0 - [id number] Intraocular Lens LENS IOL DIOP 20.0 [id number]  Left 1 Implanted    Procedure(s): CATARACT EXTRACTION PHACO AND INTRAOCULAR LENS PLACEMENT (IOC) LEFT  00:56.3  18.2%  10.24 (Left)  Electronically signed: Birder Robson 05/27/2019 1:11 PM

## 2019-05-27 NOTE — Transfer of Care (Signed)
Immediate Anesthesia Transfer of Care Note  Patient: Tara Griffin  Procedure(s) Performed: CATARACT EXTRACTION PHACO AND INTRAOCULAR LENS PLACEMENT (IOC) LEFT  00:56.3  18.2%  10.24 (Left Eye)  Patient Location: PACU  Anesthesia Type: MAC  Level of Consciousness: awake, alert  and patient cooperative  Airway and Oxygen Therapy: Patient Spontanous Breathing and Patient connected to supplemental oxygen  Post-op Assessment: Post-op Vital signs reviewed, Patient's Cardiovascular Status Stable, Respiratory Function Stable, Patent Airway and No signs of Nausea or vomiting  Post-op Vital Signs: Reviewed and stable  Complications: No apparent anesthesia complications

## 2019-05-28 ENCOUNTER — Encounter: Payer: Self-pay | Admitting: Ophthalmology

## 2019-06-10 ENCOUNTER — Other Ambulatory Visit: Payer: Self-pay | Admitting: Internal Medicine

## 2019-06-10 DIAGNOSIS — Z1231 Encounter for screening mammogram for malignant neoplasm of breast: Secondary | ICD-10-CM

## 2019-07-16 ENCOUNTER — Ambulatory Visit
Admission: RE | Admit: 2019-07-16 | Discharge: 2019-07-16 | Disposition: A | Discharge status: Home / Self Care | Payer: Medicare Other | Source: Ambulatory Visit | Attending: Internal Medicine | Admitting: Internal Medicine

## 2019-07-16 DIAGNOSIS — Z1231 Encounter for screening mammogram for malignant neoplasm of breast: Secondary | ICD-10-CM | POA: Diagnosis present

## 2020-02-25 ENCOUNTER — Other Ambulatory Visit: Payer: Self-pay

## 2020-02-25 ENCOUNTER — Other Ambulatory Visit: Payer: Self-pay | Admitting: Orthopedic Surgery

## 2020-02-25 ENCOUNTER — Other Ambulatory Visit
Admission: RE | Admit: 2020-02-25 | Discharge: 2020-02-25 | Disposition: A | Discharge status: Home / Self Care | Payer: Medicare Other | Source: Ambulatory Visit | Attending: Orthopedic Surgery | Admitting: Orthopedic Surgery

## 2020-02-25 DIAGNOSIS — Z01812 Encounter for preprocedural laboratory examination: Secondary | ICD-10-CM | POA: Diagnosis present

## 2020-02-25 DIAGNOSIS — Z20822 Contact with and (suspected) exposure to covid-19: Secondary | ICD-10-CM | POA: Insufficient documentation

## 2020-02-25 LAB — SARS CORONAVIRUS 2 (TAT 6-24 HRS): SARS Coronavirus 2: NEGATIVE

## 2020-02-26 ENCOUNTER — Other Ambulatory Visit: Payer: Self-pay

## 2020-02-26 ENCOUNTER — Ambulatory Visit: Payer: Medicare Other | Admitting: Anesthesiology

## 2020-02-26 ENCOUNTER — Ambulatory Visit: Payer: Medicare Other

## 2020-02-26 ENCOUNTER — Encounter
Admission: RE | Disposition: A | Discharge status: Home / Self Care | Payer: Self-pay | Source: Ambulatory Visit | Attending: Orthopedic Surgery

## 2020-02-26 ENCOUNTER — Ambulatory Visit
Admission: RE | Admit: 2020-02-26 | Discharge: 2020-02-26 | Disposition: A | Discharge status: Home / Self Care | Payer: Medicare Other | Source: Ambulatory Visit | Attending: Orthopedic Surgery | Admitting: Orthopedic Surgery

## 2020-02-26 ENCOUNTER — Encounter: Payer: Self-pay | Admitting: Orthopedic Surgery

## 2020-02-26 DIAGNOSIS — E785 Hyperlipidemia, unspecified: Secondary | ICD-10-CM | POA: Diagnosis not present

## 2020-02-26 DIAGNOSIS — M1990 Unspecified osteoarthritis, unspecified site: Secondary | ICD-10-CM | POA: Insufficient documentation

## 2020-02-26 DIAGNOSIS — M8589 Other specified disorders of bone density and structure, multiple sites: Secondary | ICD-10-CM | POA: Insufficient documentation

## 2020-02-26 DIAGNOSIS — K219 Gastro-esophageal reflux disease without esophagitis: Secondary | ICD-10-CM | POA: Insufficient documentation

## 2020-02-26 DIAGNOSIS — Z8781 Personal history of (healed) traumatic fracture: Secondary | ICD-10-CM

## 2020-02-26 DIAGNOSIS — Z791 Long term (current) use of non-steroidal anti-inflammatories (NSAID): Secondary | ICD-10-CM | POA: Insufficient documentation

## 2020-02-26 DIAGNOSIS — Z7982 Long term (current) use of aspirin: Secondary | ICD-10-CM | POA: Insufficient documentation

## 2020-02-26 DIAGNOSIS — W010XXA Fall on same level from slipping, tripping and stumbling without subsequent striking against object, initial encounter: Secondary | ICD-10-CM | POA: Insufficient documentation

## 2020-02-26 DIAGNOSIS — Y92481 Parking lot as the place of occurrence of the external cause: Secondary | ICD-10-CM | POA: Insufficient documentation

## 2020-02-26 DIAGNOSIS — E559 Vitamin D deficiency, unspecified: Secondary | ICD-10-CM | POA: Diagnosis not present

## 2020-02-26 DIAGNOSIS — Z79899 Other long term (current) drug therapy: Secondary | ICD-10-CM | POA: Insufficient documentation

## 2020-02-26 DIAGNOSIS — S52552A Other extraarticular fracture of lower end of left radius, initial encounter for closed fracture: Secondary | ICD-10-CM | POA: Insufficient documentation

## 2020-02-26 HISTORY — PX: OPEN REDUCTION INTERNAL FIXATION (ORIF) DISTAL RADIAL FRACTURE: SHX5989

## 2020-02-26 SURGERY — OPEN REDUCTION INTERNAL FIXATION (ORIF) DISTAL RADIUS FRACTURE
Anesthesia: General | Site: Wrist | Laterality: Left

## 2020-02-26 MED ORDER — ONDANSETRON HCL 4 MG/2ML IJ SOLN
INTRAMUSCULAR | Status: AC
  Filled 2020-02-26: qty 2

## 2020-02-26 MED ORDER — HYDROMORPHONE HCL 1 MG/ML IJ SOLN
INTRAMUSCULAR | Status: AC
  Administered 2020-02-26: 0.5 mg via INTRAVENOUS
  Filled 2020-02-26: qty 1

## 2020-02-26 MED ORDER — ORAL CARE MOUTH RINSE: 15.0000 mL | Freq: Once | OROMUCOSAL | Status: AC

## 2020-02-26 MED ORDER — LIDOCAINE HCL (CARDIAC) PF 100 MG/5ML IV SOSY
PREFILLED_SYRINGE | INTRAVENOUS | Status: DC | PRN
  Administered 2020-02-26: 100 mg via INTRAVENOUS

## 2020-02-26 MED ORDER — FENTANYL CITRATE (PF) 100 MCG/2ML IJ SOLN
25.0000 ug | INTRAMUSCULAR | Status: DC | PRN
  Administered 2020-02-26 (×3): 25 ug via INTRAVENOUS

## 2020-02-26 MED ORDER — NEOMYCIN-POLYMYXIN B GU 40-200000 IR SOLN
Status: DC | PRN
  Administered 2020-02-26: 2 mL

## 2020-02-26 MED ORDER — SUGAMMADEX SODIUM 200 MG/2ML IV SOLN
INTRAVENOUS | Status: DC | PRN
  Administered 2020-02-26: 150 mg via INTRAVENOUS

## 2020-02-26 MED ORDER — HYDROCODONE-ACETAMINOPHEN 5-325 MG PO TABS: 1.0000 | ORAL_TABLET | Freq: Four times a day (QID) | ORAL | 0 refills | Status: DC | PRN

## 2020-02-26 MED ORDER — DEXAMETHASONE SODIUM PHOSPHATE 10 MG/ML IJ SOLN
INTRAMUSCULAR | Status: DC | PRN
  Administered 2020-02-26: 10 mg via INTRAVENOUS

## 2020-02-26 MED ORDER — CHLORHEXIDINE GLUCONATE 0.12 % MT SOLN
OROMUCOSAL | Status: AC
  Filled 2020-02-26: qty 15

## 2020-02-26 MED ORDER — FENTANYL CITRATE (PF) 100 MCG/2ML IJ SOLN
INTRAMUSCULAR | Status: AC
  Filled 2020-02-26: qty 2

## 2020-02-26 MED ORDER — LACTATED RINGERS IV SOLN: INTRAVENOUS | Status: DC

## 2020-02-26 MED ORDER — PROPOFOL 10 MG/ML IV BOLUS
INTRAVENOUS | Status: AC
  Filled 2020-02-26: qty 20

## 2020-02-26 MED ORDER — ACETAMINOPHEN 10 MG/ML IV SOLN
INTRAVENOUS | Status: DC | PRN
  Administered 2020-02-26: 1000 mg via INTRAVENOUS

## 2020-02-26 MED ORDER — DEXAMETHASONE SODIUM PHOSPHATE 10 MG/ML IJ SOLN
INTRAMUSCULAR | Status: AC
  Filled 2020-02-26: qty 1

## 2020-02-26 MED ORDER — CEFAZOLIN SODIUM-DEXTROSE 2-4 GM/100ML-% IV SOLN
INTRAVENOUS | Status: AC
  Filled 2020-02-26: qty 100

## 2020-02-26 MED ORDER — ONDANSETRON HCL 4 MG/2ML IJ SOLN: 4.0000 mg | Freq: Four times a day (QID) | INTRAMUSCULAR | Status: DC | PRN

## 2020-02-26 MED ORDER — ONDANSETRON HCL 4 MG/2ML IJ SOLN: 4.0000 mg | Freq: Once | INTRAMUSCULAR | Status: DC | PRN

## 2020-02-26 MED ORDER — ROCURONIUM BROMIDE 100 MG/10ML IV SOLN
INTRAVENOUS | Status: DC | PRN
  Administered 2020-02-26: 30 mg via INTRAVENOUS

## 2020-02-26 MED ORDER — FENTANYL CITRATE (PF) 100 MCG/2ML IJ SOLN
INTRAMUSCULAR | Status: AC
  Administered 2020-02-26: 25 ug via INTRAVENOUS
  Filled 2020-02-26: qty 2

## 2020-02-26 MED ORDER — ONDANSETRON HCL 4 MG/2ML IJ SOLN
INTRAMUSCULAR | Status: DC | PRN
  Administered 2020-02-26: 4 mg via INTRAVENOUS

## 2020-02-26 MED ORDER — HYDROCODONE-ACETAMINOPHEN 5-325 MG PO TABS: 1.0000 | ORAL_TABLET | ORAL | Status: DC | PRN

## 2020-02-26 MED ORDER — ACETAMINOPHEN 10 MG/ML IV SOLN
INTRAVENOUS | Status: AC
  Filled 2020-02-26: qty 100

## 2020-02-26 MED ORDER — FENTANYL CITRATE (PF) 100 MCG/2ML IJ SOLN
INTRAMUSCULAR | Status: DC | PRN
  Administered 2020-02-26: 100 ug via INTRAVENOUS
  Administered 2020-02-26 (×2): 50 ug via INTRAVENOUS

## 2020-02-26 MED ORDER — METOCLOPRAMIDE HCL 5 MG/ML IJ SOLN: 5.0000 mg | Freq: Three times a day (TID) | INTRAMUSCULAR | Status: DC | PRN

## 2020-02-26 MED ORDER — PROPOFOL 10 MG/ML IV BOLUS
INTRAVENOUS | Status: DC | PRN
  Administered 2020-02-26: 80 mg via INTRAVENOUS
  Administered 2020-02-26 (×2): 50 mg via INTRAVENOUS
  Administered 2020-02-26: 70 mg via INTRAVENOUS

## 2020-02-26 MED ORDER — METOPROLOL TARTRATE 5 MG/5ML IV SOLN
INTRAVENOUS | Status: AC
  Filled 2020-02-26: qty 5

## 2020-02-26 MED ORDER — METOCLOPRAMIDE HCL 10 MG PO TABS: 5.0000 mg | ORAL_TABLET | Freq: Three times a day (TID) | ORAL | Status: DC | PRN

## 2020-02-26 MED ORDER — ONDANSETRON HCL 4 MG PO TABS: 4.0000 mg | ORAL_TABLET | Freq: Four times a day (QID) | ORAL | Status: DC | PRN

## 2020-02-26 MED ORDER — CHLORHEXIDINE GLUCONATE 0.12 % MT SOLN
15.0000 mL | Freq: Once | OROMUCOSAL | Status: AC
  Administered 2020-02-26: 15 mL via OROMUCOSAL

## 2020-02-26 MED ORDER — CEFAZOLIN SODIUM-DEXTROSE 2-4 GM/100ML-% IV SOLN
2.0000 g | INTRAVENOUS | Status: AC
  Administered 2020-02-26: 2 g via INTRAVENOUS

## 2020-02-26 MED ORDER — SODIUM CHLORIDE 0.9 % IV SOLN: INTRAVENOUS | Status: DC

## 2020-02-26 MED ORDER — HYDROCODONE-ACETAMINOPHEN 5-325 MG PO TABS
ORAL_TABLET | ORAL | Status: AC
  Filled 2020-02-26: qty 1

## 2020-02-26 MED ORDER — HYDROMORPHONE HCL 1 MG/ML IJ SOLN
0.5000 mg | INTRAMUSCULAR | Status: DC | PRN
  Administered 2020-02-26: 0.5 mg via INTRAVENOUS

## 2020-02-26 SURGICAL SUPPLY — 40 items
BIT DRILL 2 FAST STEP (BIT) ×2 IMPLANT
BIT DRILL 2.5X4 QC (BIT) ×2 IMPLANT
BNDG ELASTIC 4X5.8 VLCR STR LF (GAUZE/BANDAGES/DRESSINGS) ×3 IMPLANT
CANISTER SUCT 1200ML W/VALVE (MISCELLANEOUS) ×3 IMPLANT
CHLORAPREP W/TINT 26 (MISCELLANEOUS) ×3 IMPLANT
COVER WAND RF STERILE (DRAPES) ×3 IMPLANT
CUFF TOURN SGL QUICK 18X4 (TOURNIQUET CUFF) ×2 IMPLANT
DRAPE FLUOR MINI C-ARM 54X84 (DRAPES) ×3 IMPLANT
ELECT REM PT RETURN 9FT ADLT (ELECTROSURGICAL) ×3
ELECTRODE REM PT RTRN 9FT ADLT (ELECTROSURGICAL) ×1 IMPLANT
GAUZE SPONGE 4X4 12PLY STRL (GAUZE/BANDAGES/DRESSINGS) ×3 IMPLANT
GAUZE XEROFORM 1X8 LF (GAUZE/BANDAGES/DRESSINGS) ×6 IMPLANT
GLOVE SURG SYN 9.0  PF PI (GLOVE) ×2
GLOVE SURG SYN 9.0 PF PI (GLOVE) ×1 IMPLANT
GOWN SRG 2XL LVL 4 RGLN SLV (GOWNS) ×1 IMPLANT
GOWN STRL NON-REIN 2XL LVL4 (GOWNS) ×2
GOWN STRL REUS W/ TWL LRG LVL3 (GOWN DISPOSABLE) ×1 IMPLANT
GOWN STRL REUS W/TWL LRG LVL3 (GOWN DISPOSABLE) ×2
K-WIRE 1.6 (WIRE) ×2
K-WIRE FX5X1.6XNS BN SS (WIRE) ×1
KIT TURNOVER KIT A (KITS) ×3 IMPLANT
KWIRE FX5X1.6XNS BN SS (WIRE) IMPLANT
NDL FILTER BLUNT 18X1 1/2 (NEEDLE) ×1 IMPLANT
NEEDLE FILTER BLUNT 18X 1/2SAF (NEEDLE) ×2
NEEDLE FILTER BLUNT 18X1 1/2 (NEEDLE) ×1 IMPLANT
NS IRRIG 500ML POUR BTL (IV SOLUTION) ×3 IMPLANT
PACK EXTREMITY (MISCELLANEOUS) ×3 IMPLANT
PAD CAST CTTN 4X4 STRL (SOFTGOODS) ×2 IMPLANT
PADDING CAST COTTON 4X4 STRL (SOFTGOODS) ×4
PEG SUBCHONDRAL SMOOTH 2.0X14 (Peg) ×2 IMPLANT
PEG SUBCHONDRAL SMOOTH 2.0X20 (Peg) ×10 IMPLANT
PLATE SHORT 21.6X48.9 NRRW LT (Plate) ×2 IMPLANT
SCALPEL PROTECTED #15 DISP (BLADE) ×6 IMPLANT
SCREW CORT 3.5X10 LNG (Screw) ×6 IMPLANT
SPLINT CAST 1 STEP 3X12 (MISCELLANEOUS) ×3 IMPLANT
SUT ETHILON 4-0 (SUTURE) ×2
SUT ETHILON 4-0 FS2 18XMFL BLK (SUTURE) ×1
SUT VICRYL 3-0 27IN (SUTURE) ×3 IMPLANT
SUTURE ETHLN 4-0 FS2 18XMF BLK (SUTURE) ×1 IMPLANT
SYR 3ML LL SCALE MARK (SYRINGE) ×3 IMPLANT

## 2020-02-26 NOTE — Anesthesia Procedure Notes (Addendum)
Procedure Name: LMA Insertion Date/Time: 02/26/2020 2:00 PM Performed by: Doreen Salvage, CRNA Pre-anesthesia Checklist: Patient identified, Patient being monitored, Timeout performed, Emergency Drugs available and Suction available Patient Re-evaluated:Patient Re-evaluated prior to induction Oxygen Delivery Method: Circle system utilized Preoxygenation: Pre-oxygenation with 100% oxygen Induction Type: IV induction Ventilation: Mask ventilation without difficulty LMA: LMA inserted LMA Size: 3.5 and 4.0 Tube type: Oral Number of attempts: 2 Placement Confirmation: positive ETCO2 and breath sounds checked- equal and bilateral Tube secured with: Tape Dental Injury: Teeth and Oropharynx as per pre-operative assessment

## 2020-02-26 NOTE — H&P (Signed)
Chief Complaint  Patient presents with  . Left Wrist Fracture  [place] on 02/18/2020   Tara Griffin is a 79 y.o. female who presents today for evaluation of a left wrist injury sustained 1 week ago. The patient was in a parking lot when she tripped and fell, she reached out with her left hand and attempted catch her self and fell landing on the left wrist. The patient went to the hospital where x-rays demonstrate a transverse fracture of the left distal radius with dorsal angulation. She was placed in a sugar tong splint and instructed to follow-up orthopedics. She presents today reporting mild discomfort in the left wrist. She denies any numbness or tingling to the left extremity. She denies any repeat trauma or injury. She denies any previous surgical history to the left hand. She is right-hand dominant. She is extremely active. She denies any personal history of heart attack, stroke, asthma or COPD. No personal history of blood clots.  Past Medical History: Past Medical History:  Diagnosis Date  . Arthritis  . GERD (gastroesophageal reflux disease)  . Hyperlipidemia  . 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 Vitamin D deficiency 10/03/2016   Past Surgical History: Past Surgical History:  Procedure Laterality Date  . HYSTERECTOMY   Past Family History: Family History  Problem Relation Age of Onset  . Pancreatic cancer Mother  . Leukemia Father   Medications: Current Outpatient Medications Ordered in Epic  Medication Sig Dispense Refill  . aspirin 81 MG EC tablet Take 81 mg by mouth once daily.  . calcium cit-vit D3-isoflavon 2 100-1,000-80 mg-unit-mg Tab Take by mouth.  . escitalopram oxalate (LEXAPRO) 10 MG tablet Take 1 tablet (10 mg total) by mouth once daily 30 tablet 11  . fluticasone propionate (FLONASE) 50 mcg/actuation nasal spray Shake liquid and use 2 sprays in each nostril every day 48 g 2  . gabapentin (NEURONTIN) 100 MG capsule TAKE 1 CAPSULE(100 MG) BY  MOUTH EVERY NIGHT 30 capsule 0  . ibuprofen (MOTRIN) 200 MG tablet Take 400 mg by mouth 2 (two) times daily  . meloxicam (MOBIC) 15 MG tablet TAKE 1 TABLET(15 MG) BY MOUTH EVERY DAY 90 tablet 0  . MULTIVIT-MINERALS/FERROUS FUM (MULTI VITAMIN ORAL) Take by mouth.  Marland Kitchen omeprazole (PRILOSEC) 40 MG DR capsule Take 1 capsule (40 mg total) by mouth once daily 90 capsule 1  . simvastatin (ZOCOR) 10 MG tablet Take 1 tablet (10 mg total) by mouth nightly 90 tablet 3   No current Epic-ordered facility-administered medications on file.   Allergies: No Known Allergies   Review of Systems:  A comprehensive 14 point ROS was performed, reviewed by me today, and the pertinent orthopaedic findings are documented in the HPI.  Exam: BP 132/80  Ht 162.6 cm (5\' 4" )  Wt 65.7 kg (144 lb 12.8 oz)  BMI 24.85 kg/m  General/Constitutional: The patient appears to be well-nourished, well-developed, and in no acute distress. Neuro/Psych: Normal mood and affect, oriented to person, place and time. Eyes: Non-icteric. Pupils are equal, round, and reactive to light, and exhibit synchronous movement. ENT: Unremarkable. Lymphatic: No palpable adenopathy. Respiratory: Lungs clear to auscultation, Normal chest excursion, No wheezes and Non-labored breathing Cardiovascular: Regular rate and rhythm. No murmurs. and No edema, swelling or tenderness, except as noted in detailed exam. Integumentary: No impressive skin lesions present, except as noted in detailed exam. Musculoskeletal: Unremarkable, except as noted in detailed exam.  The patient was removed from the sugar tong splint at  today's visit. Skin examination of the left wrist demonstrates no abrasions, open wounds or signs of infection. The patient does have moderate ecchymosis on the volar aspect of the wrist. Moderate swelling of the patient's fingers with moderate ecchymosis. She is tender to palpation over the distal ulna and distal radius. Wrist flexion and  extension was not evaluated at today's visit. Supination and pronation of the left forearm was not evaluated. She is able to gently flex and extend the left elbow without pain. She is intact light touch on the left upper extremity. Cap refills intact to each individual finger. Radial pulses intact to the left wrist.  Imaging: AP, lateral oblique images of the left wrist were obtained today in the office and reviewed by me. These x-rays demonstrate evidence of a distal radius fracture transverse nature. There is mild shortening and dorsal angulation. There does appear to be a questionable nondisplaced ulnar styloid fracture. No other acute fractures visualized. No lytic lesions noted.  Impression: Closed Colles' fracture of left radius, initial encounter [S52.532A] Closed Colles' fracture of left radius, initial encounter (primary encounter diagnosis)  Plan:  1. Treatment options were discussed today with the patient. 2. Had a long discussion on the pros and cons of surgical versus nonsurgical intervention. 3. The patient is extremely active does use her hand for routine activities. Daughter-in-law and patient decided to undergo surgery at this time. 4. Risk and benefits of surgery were discussed in detail with the patient. The patient will be scheduled for a left distal radius ORIF with Dr. Rudene Christians on 02/26/2020. 5. This document will serve as the surgical history and physical for the patient. The patient was offered and received a left Velcro wrist went to stay in until surgery, she was not sure that she will be going back into the Velcro splint after surgery as well. 6. The patient will follow-up per standard post-op protocol. They can call the clinic they have any questions, new symptoms develop or symptoms worsen.  The procedure was discussed with the patient, as were the potential risks (including bleeding, infection, nerve and/or blood vessel injury, persistent or recurrent pain, failure of the  hardware, need for hardware removal, progression of arthritis, need for further surgery, blood clots, strokes, heart attacks and/or arhythmias, pneumonia, etc.) and benefits. The patient states her understanding and wishes to proceed.  This office visit took 45 minutes, of which >50% involved patient counseling/education.  Review of the [place] CSRS was performed in accordance of the [place] prior to dispensing any controlled drugs.  This note was generated in part with voice recognition software and I apologize for any typographical errors that were not detected and corrected.  Raquel James, PA-C [facility]   Electronically signed by Lattie Corns, PA at 02/25/2020 11:52 AM EDT  Reviewed paper H+P, will be scanned into chart. No changes noted.

## 2020-02-26 NOTE — Op Note (Signed)
02/26/2020  2:57 PM  PATIENT:  Tara Griffin  79 y.o. female  PRE-OPERATIVE DIAGNOSIS:  wrist fracture distal radius extra-articular left  POST-OPERATIVE DIAGNOSIS:  wrist fracture same  PROCEDURE:  Procedure(s): LEFT OPEN REDUCTION INTERNAL FIXATION (ORIF) DISTAL RADIAL FRACTURE (Left)  SURGEON: Laurene Footman, MD  ASSISTANTS: None  ANESTHESIA:   general  EBL:  Total I/O In: 500 [I.V.:300; IV Piggyback:200] Out: 3 [Blood:3]  BLOOD ADMINISTERED:none  DRAINS: none   LOCAL MEDICATIONS USED:  NONE  SPECIMEN:  No Specimen  DISPOSITION OF SPECIMEN:  N/A  COUNTS:  YES  TOURNIQUET:   Total Tourniquet Time Documented: Upper Arm (Left) - 20 minutes Total: Upper Arm (Left) - 20 minutes   IMPLANTS: [employer] DVR short narrow plate with multiple smooth pegs and screws  DICTATION: .Dragon Dictation patient was brought to the operating room and after adequate general anesthesia was obtained the left arm was prepped and draped in the usual sterile fashion.  After patient identification and timeout procedures were completed tourniquet was raised.  Volar approach was made centered over the FCR tendon with tendon sheath incised the tendon retracted radially protect the radial artery and associated veins.  Deep fascia was incised and the muscles retracted ulnarly to expose the pronator which was elevated off the radial side of the proximal and distal fragments.  Traction was applied over the end of the armboard with 10 pounds of traction.  This helped restore length there is still some significant angulation and a distal first approach was carried out.  The plate was placed distally and pinned in position with multiple smooth pegs placed first with drilling measuring and then placing the smooth pegs.  There was no penetration into the joint.  The plate was then brought down to the shaft and 3 10 mm screws were placed with traction removed on exam the fracture was nearly anatomically  reduced and aligned.  The tourniquet was let down and the wound irrigated.  The wound was closed with 3-0 Vicryl subcutaneously 4-0 nylon skin sutures then covered with Xeroform 4 x 4 web roll and volar splint followed by an Ace wrap.  PLAN OF CARE: Discharge to home after PACU  PATIENT DISPOSITION:  PACU - hemodynamically stable.

## 2020-02-26 NOTE — OR Nursing (Signed)
Dr. Ronelle Nigh has reviewed EKG from today and has cleared her to proceed to surgery.

## 2020-02-26 NOTE — Discharge Instructions (Addendum)
Keep arm elevated and work on finger range of motion. Ice to the back of the wrist may help for the next few days with pain and swelling. Pain medicine as directed. Call office if you are having any problems.   AMBULATORY SURGERY  DISCHARGE INSTRUCTIONS   1) The drugs that you were given will stay in your system until tomorrow so for the next 24 hours you should not:  A) Drive an automobile B) Make any legal decisions C) Drink any alcoholic beverage   2) You may resume regular meals tomorrow.  Today it is better to start with liquids and gradually work up to solid foods.  You may eat anything you prefer, but it is better to start with liquids, then soup and crackers, and gradually work up to solid foods.   3) Please notify your doctor immediately if you have any unusual bleeding, trouble breathing, redness and pain at the surgery site, drainage, fever, or pain not relieved by medication.    4) Additional Instructions:     Please contact your physician with any problems or Same Day Surgery at [phone], Monday through Friday 6 am to 4 pm, or [place] at [facility] number at [phone].

## 2020-02-26 NOTE — OR Nursing (Signed)
May resume aspirin today 02/26/20 per Dr. Rudene Christians, secure chat; added to discharge instructions, med section.

## 2020-02-26 NOTE — Anesthesia Postprocedure Evaluation (Signed)
Anesthesia Post Note  Patient: Tara Griffin  Procedure(s) Performed: LEFT OPEN REDUCTION INTERNAL FIXATION (ORIF) DISTAL RADIAL FRACTURE (Left Wrist)  Patient location during evaluation: PACU Anesthesia Type: General Level of consciousness: awake and alert Pain management: pain level controlled Vital Signs Assessment: post-procedure vital signs reviewed and stable Respiratory status: spontaneous breathing and respiratory function stable Cardiovascular status: stable Anesthetic complications: no     Last Vitals:  Vitals:   02/26/20 1214 02/26/20 1507  BP: (!) 146/68 (!) 134/52  Pulse: 86 95  Resp: 16 15  Temp: 36.8 C 36.7 C  SpO2: 97% 100%    Last Pain:  Vitals:   02/26/20 1519  TempSrc:   PainSc: 8                  Ski Polich K

## 2020-02-26 NOTE — Transfer of Care (Signed)
Immediate Anesthesia Transfer of Care Note  Patient: Tara Griffin  Procedure(s) Performed: LEFT OPEN REDUCTION INTERNAL FIXATION (ORIF) DISTAL RADIAL FRACTURE (Left Wrist)  Patient Location: PACU  Anesthesia Type:General  Level of Consciousness: drowsy  Airway & Oxygen Therapy: Patient Spontanous Breathing and Patient connected to face mask oxygen  Post-op Assessment: Report given to RN  Post vital signs: stable  Last Vitals:  Vitals Value Taken Time  BP    Temp    Pulse    Resp    SpO2      Last Pain:  Vitals:   02/26/20 1214  TempSrc: Temporal  PainSc: 0-No pain         Complications: No apparent anesthesia complications

## 2020-02-26 NOTE — Anesthesia Procedure Notes (Signed)
Procedure Name: Intubation Date/Time: 02/26/2020 2:13 PM Performed by: Doreen Salvage, CRNA Pre-anesthesia Checklist: Patient identified, Patient being monitored, Timeout performed, Emergency Drugs available and Suction available Patient Re-evaluated:Patient Re-evaluated prior to induction Oxygen Delivery Method: Circle system utilized Preoxygenation: Pre-oxygenation with 100% oxygen Induction Type: IV induction Ventilation: Mask ventilation without difficulty Laryngoscope Size: Mac, 3 and McGraph Grade View: Grade I Tube type: Oral Tube size: 7.0 mm Number of attempts: 1 Airway Equipment and Method: Stylet Placement Confirmation: ETT inserted through vocal cords under direct vision,  positive ETCO2 and breath sounds checked- equal and bilateral Secured at: 21 cm Tube secured with: Tape Dental Injury: Teeth and Oropharynx as per pre-operative assessment

## 2020-02-26 NOTE — Anesthesia Preprocedure Evaluation (Addendum)
Anesthesia Evaluation  Patient identified by MRN, date of birth, ID band Patient awake    Reviewed: Allergy & Precautions, NPO status , Patient's Chart, lab work & pertinent test results  History of Anesthesia Complications Negative for: history of anesthetic complications  Airway Mallampati: II       Dental   Pulmonary neg sleep apnea, neg COPD, Not current smoker,           Cardiovascular (-) hypertension(-) Past MI and (-) CHF (-) dysrhythmias (-) Valvular Problems/Murmurs     Neuro/Psych neg Seizures Depression    GI/Hepatic Neg liver ROS, GERD  Medicated and Controlled,  Endo/Other  neg diabetes  Renal/GU negative Renal ROS     Musculoskeletal   Abdominal   Peds  Hematology   Anesthesia Other Findings   Reproductive/Obstetrics                            Anesthesia Physical Anesthesia Plan  ASA: II  Anesthesia Plan: General   Post-op Pain Management:    Induction: Intravenous  PONV Risk Score and Plan: 3 and Ondansetron, Dexamethasone and Treatment may vary due to age or medical condition  Airway Management Planned: LMA  Additional Equipment:   Intra-op Plan:   Post-operative Plan:   Informed Consent: I have reviewed the patients History and Physical, chart, labs and discussed the procedure including the risks, benefits and alternatives for the proposed anesthesia with the patient or authorized representative who has indicated his/her understanding and acceptance.       Plan Discussed with:   Anesthesia Plan Comments:         Anesthesia Quick Evaluation

## 2020-03-17 DIAGNOSIS — Z8781 Personal history of (healed) traumatic fracture: Secondary | ICD-10-CM | POA: Insufficient documentation

## 2020-03-22 ENCOUNTER — Encounter: Payer: Self-pay | Admitting: Physical Therapy

## 2020-03-22 ENCOUNTER — Ambulatory Visit: Payer: Medicare Other | Attending: Orthopedic Surgery | Admitting: Physical Therapy

## 2020-03-22 ENCOUNTER — Other Ambulatory Visit: Payer: Self-pay

## 2020-03-22 DIAGNOSIS — R2689 Other abnormalities of gait and mobility: Secondary | ICD-10-CM | POA: Diagnosis present

## 2020-03-22 DIAGNOSIS — R29898 Other symptoms and signs involving the musculoskeletal system: Secondary | ICD-10-CM | POA: Insufficient documentation

## 2020-03-23 ENCOUNTER — Encounter: Payer: Self-pay | Admitting: Physical Therapy

## 2020-03-23 NOTE — Therapy (Signed)
[facility] [address], [place], [address] Phone: [phone]   Fax:  [phone]  Physical Therapy Evaluation  Patient Details  Name: Tara Griffin MRN: [id number] Date of Birth: [birth year] No data recorded  Encounter Date: 03/22/2020   PT End of Session - 03/23/20 1155    Visit Number 1    Number of Visits 16    Date for PT Re-Evaluation 05/18/20    PT Start Time 1600    PT Stop Time 1700    PT Time Calculation (min) 60 min    Equipment Utilized During Treatment Gait belt    Activity Tolerance Patient tolerated treatment well    Behavior During Therapy WFL for tasks assessed/performed           Past Medical History:  Diagnosis Date  . Arthritis   . Depression   . GERD (gastroesophageal reflux disease)   . Hypercholesteremia   . Right knee pain   . Spinal stenosis     Past Surgical History:  Procedure Laterality Date  . ABDOMINAL HYSTERECTOMY  1980   partial  . BREAST CYST ASPIRATION    . CATARACT EXTRACTION W/PHACO Right 05/06/2019   Procedure: CATARACT EXTRACTION PHACO AND INTRAOCULAR LENS PLACEMENT ([place]) RIGHT;  Surgeon: Birder Robson, MD;  Location: [facility];  Service: Ophthalmology;  Laterality: Right;  . CATARACT EXTRACTION W/PHACO Left 05/27/2019   Procedure: CATARACT EXTRACTION PHACO AND INTRAOCULAR LENS PLACEMENT ([facility]) LEFT  00:56.3  18.2%  10.24;  Surgeon: Birder Robson, MD;  Location: [facility];  Service: Ophthalmology;  Laterality: Left;  . OPEN REDUCTION INTERNAL FIXATION (ORIF) DISTAL RADIAL FRACTURE Left 02/26/2020   Procedure: LEFT OPEN REDUCTION INTERNAL FIXATION (ORIF) DISTAL RADIAL FRACTURE;  Surgeon: Hessie Knows, MD;  Location: [facility];  Service: Orthopedics;  Laterality: Left;    There were no vitals filed for this visit.    Subjective Assessment - 03/23/20 1154    Pertinent History Pt is a 79 y.o retired female reporting to PT with c/o  impairments of balance and gait.  Pt 4 weeks s/p injurious FOOSH on concrete resulting in a L wrist fx, no other reported injuries.  Pt reports this is one of 2 falls in the past 6 months with 2 additional near fall also reported.  Pt also had an injurious fall last year that resulted in a concussion and cracked rib.  Pt reports falls often occur to her left with quick turns and changes in direction.  Pt expresses feeling faint/dizzy with standing up from a bent over position and when rising from the bed; compensates in the morning by sitting EOB until feeling subsides, and avoids bending activities.  Pt frequently expresses fear of falling that is interfering with her ability to complete ADLs and fully participate in the community d/t avoidance behavior.  Pt says she has fear of falling with walking and that she walks slower as a result of fear and has increased difficulty/fear with stairs, inclines, and uneven surfaces.  Pt lives alone in a single story home with no stairs; reports that she avoids stairs d/t fear of falling.  Pt's husband passed away last year to whom she was the primary caregiver; expresses that she has been having a hard time since his passing.  Pt reports having a good family and community support system, including her son and daughter in law, and her neighbors.  Pt has a 21 y.o. great grandson she would like to be  able to care for without fear of falling; pt would also like to decrease her fall risk.  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  Pt also with hx of R knee pain; received injection prior to most recent fall.  Pt denies N/V, B&B changes, unexplained weight fluctuation, saddle paresthesia, fever, night sweats, or unrelenting night pain at this time.    Limitations Walking;Lifting;House hold activities;Standing    How long can you sit comfortably? Unlimited    How long can you stand comfortably? Unlimited    How long can you walk comfortably?  Unlimited but with fear of falling; avoids    Patient Stated Goals decrease fall risk    Currently in Pain? No/denies           OBJECTIVE  MUSCULOSKELETAL: Tremor: Absent Bulk: Normal Tone: Normal, no clonus  Posture Reduced hip extension with standing posture with slight knee and hip flexion.  Forward head posture and rounded shoulders.  Gait Pt amb with SPC on R with little to no hip extension, no trailing limb posture, decreased B step length with reduced foot clearance shuffling gait.  Pt with forward head posture, rounded shoulders, guarded posture with reduced arm swing, often looking at the ground when walking.  Strength R/L 4/4 Hip flexion 4+/5 Hip external rotation 4/4 Hip internal rotation 3+/3+ Hip extension  4+/4+ Hip abduction 4+/5 Hip adduction 4+/4+ Knee extension 5/5 Knee flexion 5/5 Ankle Plantarflexion 5/5 Ankle Dorsiflexion   NEUROLOGICAL:  Mental Status Patient is oriented to person, place and time.  Recent memory is intact.  Remote memory is intact.  Attention span and concentration are intact.  Expressive speech is intact.  Patient's fund of knowledge is within normal limits for educational level.  Sensation Grossly intact to light touch bilateral UEs/LEs as determined by testing dermatomes C2-T2/L2-S2 respectively Proprioception and hot/cold testing deferred on this date  Coordination/Cerebellar Heel to Shin: WNL  FUNCTIONAL OUTCOME MEASURES   Results Comments  BERG 50/56   FGA 19/30 Fall risk; in need of intervention  10 Meter Gait Speed Self-selected: 0.74 m/s; Fastest: deferred Below normative values for full community ambulation  ABC Scale 39.69% Fall risk; in need of intervention  [place] Deferred next session     POSTURAL CONTROL TESTS   Modified Clinical Test of Sensory Interaction for Balance    (CTSIB):  CONDITION TIME STRATEGY SWAY  Eyes open, firm surface 30 seconds ankle None  Eyes closed, firm surface 30 seconds ankle  None  Eyes open, foam surface 30 seconds ankle None  Eyes closed, foam surface 30 seconds ankle & minimal hip Minimal sway; pt subjectively reports increased sway and feeling unsafe   OCCULOMOTOR/ VESTIBULAR TESTS  Occulomotor Exam: Deferred to next visit BPPV Tests: Deferred to next visit   TherEx: -Alternating marching with BUE support x20 -SLS with no UE support: R: 13 seconds L: 3 seconds -SLS with BUE support x30 sec B with cueing for use of UE for balance only with no WB -Standing hip extension with BUE support x10 B with cueing for upright posture and glute activation    Objective measurements completed on examination: See above findings.      PT Long Term Goals - 03/23/20 1305      PT LONG TERM GOAL #1   Title Pt will increase FOTO score to 64 to indicate improved functional mobility with ADLs.    Baseline 03/22/20 FOTO 56    Time 8  Period Weeks    Status New    Target Date 05/17/20      PT LONG TERM GOAL #2   Title Pt will improve ABC by at least 13% in order to demonstrate clinically significant improvement in balance confidence.    Baseline 03/22/20 ABC 39.69%    Time 8    Period Weeks    Status New    Target Date 05/17/20      PT LONG TERM GOAL #3   Title Pt will increase gait speed to at least 1.2 m/s for full community ambulation.    Baseline 03/22/20    Time 8    Period Weeks    Status New    Target Date 05/17/20      PT LONG TERM GOAL #4   Title Pt will improve FGA by at least 4 points to indicate clinically significant improvement in balance and decreased risk for falls.    Baseline 03/22/20 FGA 19/30    Time 8    Period Weeks    Status New    Target Date 05/17/20                  Plan - 03/23/20 1338    Clinical Impression Statement Pt is a pleasant 79 y.o. female referred for difficulty with balance and gait.  PT examination reveals increased fall risk and decreased balance confidence.  PT examination reveals deficits in strength,  balance, proprioception, gait, and posture. Activity limitations in stair negotation, gait on uneven surfaces, bending/stooping, and prolonged ambulation; inhibiting participation in community activity, and safe ADLs. Occulomotor, vestibular and BPPV testing to be further assessed next visit to further tease out other contributions to balance impairments.  Pt will benefit from skilled PT services to address deficits in balance and decrease risk for future falls.    Personal Factors and Comorbidities Age    Examination-Activity Limitations Reach Overhead;Stairs;Stand;Dressing;Bend;Caring for Others;Carry;Locomotion Level;Squat;Lift;Bathing    Examination-Participation Restrictions Cleaning;Shop;Laundry;Community Activity;Yard Work    [employer] Evolving/Moderate complexity    Clinical Decision Making Moderate    Rehab Potential Good    PT Frequency 2x / week    PT Duration 8 weeks    PT Treatment/Interventions ADLs/Self Care Home Management;Moist Heat;Traction;DME Instruction;Gait training;Therapeutic activities;Functional mobility training;Stair training;Therapeutic exercise;Balance training;Neuromuscular re-education;Patient/family education;Manual techniques;Dry needling;Passive range of motion;Energy conservation;Vestibular;Visual/perceptual remediation/compensation;Canalith Repostioning;Cryotherapy;Cognitive remediation    PT Next Visit Plan Further assess occulomotor/vestibular systems, BPPV testing, gait training with head turns/quick turns    PT Home Exercise Plan Marching, hip extension, and SLS with counter support    Consulted and Agree with Plan of Care Patient           Patient will benefit from skilled therapeutic intervention in order to improve the following deficits and impairments:  Abnormal gait, Decreased activity tolerance, Decreased endurance, Decreased knowledge of use of DME, Decreased range of motion, Decreased strength, Dizziness, Hypomobility,  Impaired perceived functional ability, Improper body mechanics, Decreased balance, Decreased coordination, Decreased mobility, Difficulty walking, Impaired flexibility, Postural dysfunction, Impaired vision/preception  Visit Diagnosis: Balance problem  Leg weakness, bilateral  Abnormality of gait due to impairment of balance     Problem List There are no problems to display for this patient.  Durwin Reges DPT Tara Griffin, SPT Durwin Reges 03/23/2020, 5:57 PM  [facility] [address], [place], [address] Phone: [phone]   Fax:  [phone]  Name: Tara Griffin MRN: [id number] Date of Birth: Sep 11, 1941

## 2020-03-25 ENCOUNTER — Other Ambulatory Visit: Payer: Self-pay

## 2020-03-25 ENCOUNTER — Encounter: Payer: Self-pay | Admitting: Physical Therapy

## 2020-03-25 ENCOUNTER — Ambulatory Visit: Payer: Medicare Other | Attending: Orthopedic Surgery | Admitting: Physical Therapy

## 2020-03-25 DIAGNOSIS — R29898 Other symptoms and signs involving the musculoskeletal system: Secondary | ICD-10-CM | POA: Diagnosis present

## 2020-03-25 DIAGNOSIS — R2689 Other abnormalities of gait and mobility: Secondary | ICD-10-CM | POA: Diagnosis not present

## 2020-03-25 NOTE — Therapy (Signed)
[facility] [address], [place], [address] Phone: [phone]   Fax:  [phone]  Physical Therapy Treatment  Patient Details  Name: Tara Griffin MRN: [id number] Date of Birth: [birth year] No data recorded  Encounter Date: 03/25/2020   PT End of Session - 03/25/20 1124    Visit Number 2    Number of Visits 16    Date for PT Re-Evaluation 05/18/20    PT Start Time 1120    PT Stop Time 1200    PT Time Calculation (min) 40 min    Equipment Utilized During Treatment Gait belt    Activity Tolerance Patient tolerated treatment well    Behavior During Therapy WFL for tasks assessed/performed           Past Medical History:  Diagnosis Date  . Arthritis   . Depression   . GERD (gastroesophageal reflux disease)   . Hypercholesteremia   . Right knee pain   . Spinal stenosis     Past Surgical History:  Procedure Laterality Date  . ABDOMINAL HYSTERECTOMY  1980   partial  . BREAST CYST ASPIRATION    . CATARACT EXTRACTION W/PHACO Right 05/06/2019   Procedure: CATARACT EXTRACTION PHACO AND INTRAOCULAR LENS PLACEMENT ([facility]) RIGHT;  Surgeon: Birder Robson, MD;  Location: [facility];  Service: Ophthalmology;  Laterality: Right;  . CATARACT EXTRACTION W/PHACO Left 05/27/2019   Procedure: CATARACT EXTRACTION PHACO AND INTRAOCULAR LENS PLACEMENT ([facility]) LEFT  00:56.3  18.2%  10.24;  Surgeon: Birder Robson, MD;  Location: [facility];  Service: Ophthalmology;  Laterality: Left;  . OPEN REDUCTION INTERNAL FIXATION (ORIF) DISTAL RADIAL FRACTURE Left 02/26/2020   Procedure: LEFT OPEN REDUCTION INTERNAL FIXATION (ORIF) DISTAL RADIAL FRACTURE;  Surgeon: Hessie Knows, MD;  Location: [facility];  Service: Orthopedics;  Laterality: Left;    There were no vitals filed for this visit.   Subjective Assessment - 03/25/20 1123    Subjective Pt reports no pain and that she is feeling good today. Compliance  with HEP.    Pertinent History Pt is a 79 y.o retired female reporting to PT with c/o impairments of balance and gait.  Pt 4 weeks s/p injurious FOOSH on concrete resulting in a L wrist fx, no other reported injuries.  Pt reports this is one of 2 falls in the past 6 months with 2 additional near fall also reported.  Pt also had an injurious fall last year that resulted in a concussion and cracked rib.  Pt reports falls often occur to her left with quick turns and changes in direction.  Pt expresses feeling faint/dizzy with standing up from a bent over position and when rising from the bed; compensates in the morning by sitting EOB until feeling subsides, and avoids bending activities.  Pt frequently expresses fear of falling that is interfering with her ability to complete ADLs and fully participate in the community d/t avoidance behavior.  Pt says she has fear of falling with walking and that she walks slower as a result of fear and has increased difficulty/fear with stairs, inclines, and uneven surfaces.  Pt lives alone in a single story home with no stairs; reports that she avoids stairs d/t fear of falling.  Pt's husband passed away last year to whom she was the primary caregiver; expresses that she has been having a hard time since his passing.  Pt reports having a good family and community support system, including her son and daughter in  law, and her neighbors.  Pt has a 39 y.o. great grandson she would like to be able to care for without fear of falling; pt would also like to decrease her fall risk.  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  Pt also with hx of R knee pain; received injection prior to most recent fall.  Pt denies N/V, B&B changes, unexplained weight fluctuation, saddle paresthesia, fever, night sweats, or unrelenting night pain at this time.    Limitations Walking;Lifting;House hold activities;Standing    How long can you sit comfortably? Unlimited     How long can you stand comfortably? Unlimited    How long can you walk comfortably? Unlimited but with fear of falling; avoids    Patient Stated Goals decrease fall risk    Currently in Pain? No/denies           OCULOMOTOR / VESTIBULAR TESTING:   Oculomotor Exam- Room Light  Findings Comments  Ocular Alignment Normal   Spontaneous Nystagmus Normal Absent nystagmus at rest  Gaze Holding Nystagmus Normal Normal end-range nystagmus present   Eye Movement ROM / Smooth Pursuits Abnormal Normal ROM, occasional saccadic eye movement with horizontal tracking  Vergence Normal   Saccades Normal   Cover/Uncover Test Normal   VOR Cancellation Abnormal Saccadic eye movement; dizziness reported  Head Thrust/ Head Impulse Test Normal   Head Shaking Nystagmus Normal  Shake head 20x (+) if >2 beats nystagmus present  Static Acuity Not examined   Dynamic Acuity Not examined    BPPV TESTS: Not indicated  Neuro Re-ed -Seated VOR cancellation x20 sec -Seated VORx1 x20 sec -Seated VORx2 x20sec, difficulty with coordination of opposite movements -Standing VOR cancellation x20 sec with CGA throughout all standing VOR gaze stabilization exercises -Standing VORx1 x20sec -Standing feet together VORx1 x20 sec -Semi-tandem standing VORx1 x20 sec B -Standing on foam VOR x1 x20sec -Standing feet together on foam VORx1 x20 sec with increased frequency of postural sway, no LOB -Gait 4x72ft with B trunk/head rotation to pick up and hand ball to therapist on opposite side to facilitate quick rotational movements during gait with CGA from PT for safety; pt with decreased B step length and one instance of improper foot clearance, no LOB - no dizziness reported during gait with trunk/head rotation activity; however, dizziness present with deccelaration and quick 180d turn that subsides within a few seconds      PT Education - 03/25/20 1124    Education Details Therex form/mechanics, gait mechanics     Person(s) Educated Patient    Methods Explanation;Demonstration;Verbal cues    Comprehension Verbalized understanding;Returned demonstration            PT Short Term Goals - 03/23/20 1156      PT SHORT TERM GOAL #1   Title Pt will be independent with HEP to improve strength and balance in order to decrease fall risk and improve function at home and in the community.    Baseline 03/22/20 Pt educated on HEP and handout given    Time 2    Period Weeks    Status New    Target Date 04/05/20             PT Long Term Goals - 03/23/20 1305      PT LONG TERM GOAL #1   Title Pt will increase FOTO score to 64 to indicate improved functional mobility with ADLs.    Baseline 03/22/20 FOTO 56  Time 8    Period Weeks    Status New    Target Date 05/17/20      PT LONG TERM GOAL #2   Title Pt will improve ABC by at least 13% in order to demonstrate clinically significant improvement in balance confidence.    Baseline 03/22/20 ABC 39.69%    Time 8    Period Weeks    Status New    Target Date 05/17/20      PT LONG TERM GOAL #3   Title Pt will increase gait speed to at least 1.2 m/s for full community ambulation.    Baseline 03/22/20    Time 8    Period Weeks    Status New    Target Date 05/17/20      PT LONG TERM GOAL #4   Title Pt will improve FGA by at least 4 points to indicate clinically significant improvement in balance and decreased risk for falls.    Baseline 03/22/20 FGA 19/30    Time 8    Period Weeks    Status New    Target Date 05/17/20                 Plan - 03/25/20 1250    Clinical Impression Statement PT assessment of occulomotor and vestibular systems revealed saccadic abnormalities with smooth pursuits and VOR cancellation, consistent with mild central vestibular dysfunction.  Pt reports dizziness in instances where the environment is changing around her, such as with VOR cancellation and gait with quick turns.  Pt reports these motions and feelings of  dizziness are similar to what she has experienced prior to her recent falls.  Pt tolerated gaze stabilization activities in standing with various BOS and on unstable surface with reports of some habituation to the movements with increased reps.  Pt will continue to benefit from skilled therapy to improve balance and gaze stabilization to reduce fall risk.    Personal Factors and Comorbidities Age    Examination-Activity Limitations Reach Overhead;Stairs;Stand;Dressing;Bend;Caring for Others;Carry;Locomotion Level;Squat;Lift;Bathing    Examination-Participation Restrictions Cleaning;Shop;Laundry;Community Activity;Yard Work    [employer] Evolving/Moderate complexity    Clinical Decision Making Moderate    Rehab Potential Good    PT Frequency 2x / week    PT Duration 8 weeks    PT Treatment/Interventions ADLs/Self Care Home Management;Moist Heat;Traction;DME Instruction;Gait training;Therapeutic activities;Functional mobility training;Stair training;Therapeutic exercise;Balance training;Neuromuscular re-education;Patient/family education;Manual techniques;Dry needling;Passive range of motion;Energy conservation;Vestibular;Visual/perceptual remediation/compensation;Canalith Repostioning;Cryotherapy;Cognitive remediation    PT Next Visit Plan Progress gaze stabilization exercises, gait training with quick turns, dynamic balance    PT Home Exercise Plan Marching, hip extension, SLS with counter support, seated VOR cancellation    Consulted and Agree with Plan of Care Patient           Patient will benefit from skilled therapeutic intervention in order to improve the following deficits and impairments:  Abnormal gait, Decreased activity tolerance, Decreased endurance, Decreased knowledge of use of DME, Decreased range of motion, Decreased strength, Dizziness, Hypomobility, Impaired perceived functional ability, Improper body mechanics, Decreased balance, Decreased coordination,  Decreased mobility, Difficulty walking, Impaired flexibility, Postural dysfunction, Impaired vision/preception  Visit Diagnosis: Balance problem  Leg weakness, bilateral  Abnormality of gait due to impairment of balance     Problem List There are no problems to display for this patient.  Chinita Greenland, SPT Chinita Greenland 03/25/2020, 2:35 PM  [facility] [address], [place], [address] Phone: [phone]  Fax:  [phone]  Name: Donzella Carrol MRN: [id number] Date of Birth: Jul 13, 1941

## 2020-04-01 ENCOUNTER — Encounter: Payer: Self-pay | Admitting: Physical Therapy

## 2020-04-01 ENCOUNTER — Other Ambulatory Visit: Payer: Self-pay

## 2020-04-01 ENCOUNTER — Ambulatory Visit: Payer: Medicare Other | Admitting: Physical Therapy

## 2020-04-01 DIAGNOSIS — R2689 Other abnormalities of gait and mobility: Secondary | ICD-10-CM

## 2020-04-01 DIAGNOSIS — R29898 Other symptoms and signs involving the musculoskeletal system: Secondary | ICD-10-CM

## 2020-04-01 NOTE — Therapy (Signed)
[facility] [address] [place], [address] Phone: [phone]   Fax:  [phone]  Physical Therapy Treatment  Patient Details  Name: Tara Griffin MRN: [id number] Date of Birth: 08/31/1941 No data recorded  Encounter Date: 04/01/2020   PT End of Session - 04/01/20 1151    Visit Number 3    Number of Visits 16    Date for PT Re-Evaluation 05/18/20    PT Start Time 1120    PT Stop Time 1200    PT Time Calculation (min) 40 min    Equipment Utilized During Treatment Gait belt    Activity Tolerance Patient tolerated treatment well    Behavior During Therapy WFL for tasks assessed/performed           Past Medical History:  Diagnosis Date  . Arthritis   . Depression   . GERD (gastroesophageal reflux disease)   . Hypercholesteremia   . Right knee pain   . Spinal stenosis     Past Surgical History:  Procedure Laterality Date  . ABDOMINAL HYSTERECTOMY  1980   partial  . BREAST CYST ASPIRATION    . CATARACT EXTRACTION W/PHACO Right 05/06/2019   Procedure: CATARACT EXTRACTION PHACO AND INTRAOCULAR LENS PLACEMENT ([place]) RIGHT;  Surgeon: Birder Robson, MD;  Location: [facility];  Service: Ophthalmology;  Laterality: Right;  . CATARACT EXTRACTION W/PHACO Left 05/27/2019   Procedure: CATARACT EXTRACTION PHACO AND INTRAOCULAR LENS PLACEMENT ([facility]) LEFT  00:56.3  18.2%  10.24;  Surgeon: Birder Robson, MD;  Location: [facility];  Service: Ophthalmology;  Laterality: Left;  . OPEN REDUCTION INTERNAL FIXATION (ORIF) DISTAL RADIAL FRACTURE Left 02/26/2020   Procedure: LEFT OPEN REDUCTION INTERNAL FIXATION (ORIF) DISTAL RADIAL FRACTURE;  Surgeon: Hessie Knows, MD;  Location: [facility];  Service: Orthopedics;  Laterality: Left;    There were no vitals filed for this visit.   Subjective Assessment - 04/01/20 1122    Subjective Pt reports no pain and that she is feeling good today.  Pt reports  some dizziness in the mornings when she gets up; no dizziness during the day. Compliance with HEP.    Pertinent History Pt is a 79 y.o retired female reporting to PT with c/o impairments of balance and gait.  Pt 4 weeks s/p injurious FOOSH on concrete resulting in a L wrist fx, no other reported injuries.  Pt reports this is one of 2 falls in the past 6 months with 2 additional near fall also reported.  Pt also had an injurious fall last year that resulted in a concussion and cracked rib.  Pt reports falls often occur to her left with quick turns and changes in direction.  Pt expresses feeling faint/dizzy with standing up from a bent over position and when rising from the bed; compensates in the morning by sitting EOB until feeling subsides, and avoids bending activities.  Pt frequently expresses fear of falling that is interfering with her ability to complete ADLs and fully participate in the community d/t avoidance behavior.  Pt says she has fear of falling with walking and that she walks slower as a result of fear and has increased difficulty/fear with stairs, inclines, and uneven surfaces.  Pt lives alone in a single story home with no stairs; reports that she avoids stairs d/t fear of falling.  Pt's husband passed away last year to whom she was the primary caregiver; expresses that she has been having a hard time since his passing.  Pt reports having a good family and community support system, including her son and daughter in law, and her neighbors.  Pt has a 40 y.o. great grandson she would like to be able to care for without fear of falling; pt would also like to decrease her fall risk.  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  Pt also with hx of R knee pain; received injection prior to most recent fall.  Pt denies N/V, B&B changes, unexplained weight fluctuation, saddle paresthesia, fever, night sweats, or unrelenting night pain at this time.    Limitations  Walking;Lifting;House hold activities;Standing    How long can you sit comfortably? Unlimited    How long can you stand comfortably? Unlimited    How long can you walk comfortably? Unlimited but with fear of falling; avoids    Patient Stated Goals decrease fall risk    Currently in Pain? No/denies          Neuro -Gait in clinic x71ft with horizontal head turns x6 with gait deviations ipsilateral to head turns, improved midline walking and increased step length with cueing -Gait in clinic x31ft with vertical head turns x2 with no noted gait deviations -Standing feet apart on foam VOR x1 x30 sec with improved balance and no reports of dizziness -Standing feet together on foam VORx1 x20 sec with minimal postural sway -Standing modified tandem on foam with head turns 2x30 sec B with CGA and occasional UE support for balance -Standing modified tandem on foam with VORx1 x30 sec B with CGA occasional UE support for balance -Gait with quick turns with CGA, no reported increase in dizziness and no LOB -Gait with quick turns and picking up cones with no reported increase in dizziness and no LOB -Alternating cone tapping x10 -Alternating cone tapping anterior and across midline x10     PT Education - 04/01/20 1123    Education Details Therex form/mechanics, gait mechanics    Person(s) Educated Patient    Methods Explanation;Demonstration;Verbal cues    Comprehension Verbalized understanding;Returned demonstration            PT Short Term Goals - 03/23/20 1156      PT SHORT TERM GOAL #1   Title Pt will be independent with HEP to improve strength and balance in order to decrease fall risk and improve function at home and in the community.    Baseline 03/22/20 Pt educated on HEP and handout given    Time 2    Period Weeks    Status New    Target Date 04/05/20             PT Long Term Goals - 03/23/20 1305      PT LONG TERM GOAL #1   Title Pt will increase FOTO score to 64 to  indicate improved functional mobility with ADLs.    Baseline 03/22/20 FOTO 56    Time 8    Period Weeks    Status New    Target Date 05/17/20      PT LONG TERM GOAL #2   Title Pt will improve ABC by at least 13% in order to demonstrate clinically significant improvement in balance confidence.    Baseline 03/22/20 ABC 39.69%    Time 8    Period Weeks    Status New    Target Date 05/17/20      PT LONG TERM GOAL #3   Title Pt will increase gait speed to  at least 1.2 m/s for full community ambulation.    Baseline 03/22/20    Time 8    Period Weeks    Status New    Target Date 05/17/20      PT LONG TERM GOAL #4   Title Pt will improve FGA by at least 4 points to indicate clinically significant improvement in balance and decreased risk for falls.    Baseline 03/22/20 FGA 19/30    Time 8    Period Weeks    Status New    Target Date 05/17/20                 Plan - 04/01/20 1152    Clinical Impression Statement PT continued balance, gait and gaze stabilization therapy with good success.  Pt requires supervision-CGA throughout session for safety, with noted increase in balance and reduced dizziness with gaze stabilization exercises.  Pt with increased gait deviations and decreased step length with head turns that improves with cueing; no reports of dizziness with quick turns this session.  PT will continue progression as able.    Personal Factors and Comorbidities Age    Examination-Activity Limitations Reach Overhead;Stairs;Stand;Dressing;Bend;Caring for Others;Carry;Locomotion Level;Squat;Lift;Bathing    Examination-Participation Restrictions Cleaning;Shop;Laundry;Community Activity;Yard Work    [employer] officer Evolving/Moderate complexity    Clinical Decision Making Moderate    Rehab Potential Good    PT Frequency 2x / week    PT Duration 8 weeks    PT Treatment/Interventions ADLs/Self Care Home Management;Moist Heat;Traction;DME Instruction;Gait  training;Therapeutic activities;Functional mobility training;Stair training;Therapeutic exercise;Balance training;Neuromuscular re-education;Patient/family education;Manual techniques;Dry needling;Passive range of motion;Energy conservation;Vestibular;Visual/perceptual remediation/compensation;Canalith Repostioning;Cryotherapy;Cognitive remediation    PT Next Visit Plan Continue progression of gaze stabilization exercises, gait training with quick turns, dynamic balance    PT Home Exercise Plan Marching, hip extension, SLS with counter support, seated VOR cancellation    Consulted and Agree with Plan of Care Patient           Patient will benefit from skilled therapeutic intervention in order to improve the following deficits and impairments:  Abnormal gait, Decreased activity tolerance, Decreased endurance, Decreased knowledge of use of DME, Decreased range of motion, Decreased strength, Dizziness, Hypomobility, Impaired perceived functional ability, Improper body mechanics, Decreased balance, Decreased coordination, Decreased mobility, Difficulty walking, Impaired flexibility, Postural dysfunction, Impaired vision/preception  Visit Diagnosis: Balance problem  Leg weakness, bilateral  Abnormality of gait due to impairment of balance     Problem List There are no problems to display for this patient.  Durwin Reges DPT Chinita Greenland, SPT Durwin Reges 04/01/2020, 1:48 PM  [facility] [address], [place], [address] Phone: [phone]   Fax:  [phone]  Name: Tara Griffin MRN: [id number] Date of Birth: 09/08/1941

## 2020-04-05 ENCOUNTER — Other Ambulatory Visit: Payer: Self-pay

## 2020-04-05 ENCOUNTER — Ambulatory Visit: Payer: Medicare Other | Admitting: Physical Therapy

## 2020-04-05 ENCOUNTER — Encounter: Payer: Self-pay | Admitting: Physical Therapy

## 2020-04-05 DIAGNOSIS — R2689 Other abnormalities of gait and mobility: Secondary | ICD-10-CM

## 2020-04-05 DIAGNOSIS — R29898 Other symptoms and signs involving the musculoskeletal system: Secondary | ICD-10-CM

## 2020-04-05 NOTE — Therapy (Signed)
[facility] [address] [place], [address] Phone: [phone]   Fax:  [phone]  Physical Therapy Treatment  Patient Details  Name: Tara Griffin MRN: [id number] Date of Birth: 1941/06/29 No data recorded  Encounter Date: 04/05/2020   PT End of Session - 04/05/20 1344    Visit Number 4    Number of Visits 16    Date for PT Re-Evaluation 05/18/20    PT Start Time 1300    PT Stop Time 1345    PT Time Calculation (min) 45 min    Equipment Utilized During Treatment Gait belt    Activity Tolerance Patient tolerated treatment well    Behavior During Therapy WFL for tasks assessed/performed           Past Medical History:  Diagnosis Date  . Arthritis   . Depression   . GERD (gastroesophageal reflux disease)   . Hypercholesteremia   . Right knee pain   . Spinal stenosis     Past Surgical History:  Procedure Laterality Date  . ABDOMINAL HYSTERECTOMY  1980   partial  . BREAST CYST ASPIRATION    . CATARACT EXTRACTION W/PHACO Right 05/06/2019   Procedure: CATARACT EXTRACTION PHACO AND INTRAOCULAR LENS PLACEMENT ([place]) RIGHT;  Surgeon: Birder Robson, MD;  Location: [facility];  Service: Ophthalmology;  Laterality: Right;  . CATARACT EXTRACTION W/PHACO Left 05/27/2019   Procedure: CATARACT EXTRACTION PHACO AND INTRAOCULAR LENS PLACEMENT ([facility]) LEFT  00:56.3  18.2%  10.24;  Surgeon: Birder Robson, MD;  Location: [facility];  Service: Ophthalmology;  Laterality: Left;  . OPEN REDUCTION INTERNAL FIXATION (ORIF) DISTAL RADIAL FRACTURE Left 02/26/2020   Procedure: LEFT OPEN REDUCTION INTERNAL FIXATION (ORIF) DISTAL RADIAL FRACTURE;  Surgeon: Hessie Knows, MD;  Location: [facility];  Service: Orthopedics;  Laterality: Left;    There were no vitals filed for this visit.   Subjective Assessment - 04/05/20 1303    Subjective Pt reports 5/10 low back pain that started last night (intermittent  chronic LBP).  Pt is wearing a lidocane patch for pain which is helping.  Pt is going tomorrow for a back injection for pain.  Compliance with HEP.    Pertinent History Pt is a 79 y.o retired female reporting to PT with c/o impairments of balance and gait.  Pt 4 weeks s/p injurious FOOSH on concrete resulting in a L wrist fx, no other reported injuries.  Pt reports this is one of 2 falls in the past 6 months with 2 additional near fall also reported.  Pt also had an injurious fall last year that resulted in a concussion and cracked rib.  Pt reports falls often occur to her left with quick turns and changes in direction.  Pt expresses feeling faint/dizzy with standing up from a bent over position and when rising from the bed; compensates in the morning by sitting EOB until feeling subsides, and avoids bending activities.  Pt frequently expresses fear of falling that is interfering with her ability to complete ADLs and fully participate in the community d/t avoidance behavior.  Pt says she has fear of falling with walking and that she walks slower as a result of fear and has increased difficulty/fear with stairs, inclines, and uneven surfaces.  Pt lives alone in a single story home with no stairs; reports that she avoids stairs d/t fear of falling.  Pt's husband passed away last year to whom she was the primary caregiver; expresses that she  has been having a hard time since his passing.  Pt reports having a good family and community support system, including her son and daughter in law, and her neighbors.  Pt has a 79 y.o. great grandson she would like to be able to care for without fear of falling; pt would also like to decrease her fall risk.  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  Pt also with hx of R knee pain; received injection prior to most recent fall.  Pt denies N/V, B&B changes, unexplained weight fluctuation, saddle paresthesia, fever, night sweats, or unrelenting  night pain at this time.    Limitations Walking;Lifting;House hold activities;Standing    How long can you sit comfortably? Unlimited    How long can you stand comfortably? Unlimited    How long can you walk comfortably? Unlimited but with fear of falling; avoids    Patient Stated Goals decrease fall risk    Currently in Pain? Yes    Pain Score 5     Pain Location Back    Pain Orientation Lower                                     PT Education - 04/05/20 1305    Education Details Therex form/mechanics, gait mechanics with turns    Person(s) Educated Patient    Methods Explanation;Demonstration;Verbal cues    Comprehension Verbalized understanding;Returned demonstration            PT Short Term Goals - 03/23/20 1156      PT SHORT TERM GOAL #1   Title Pt will be independent with HEP to improve strength and balance in order to decrease fall risk and improve function at home and in the community.    Baseline 03/22/20 Pt educated on HEP and handout given    Time 2    Period Weeks    Status New    Target Date 04/05/20             PT Long Term Goals - 03/23/20 1305      PT LONG TERM GOAL #1   Title Pt will increase FOTO score to 64 to indicate improved functional mobility with ADLs.    Baseline 03/22/20 FOTO 56    Time 8    Period Weeks    Status New    Target Date 05/17/20      PT LONG TERM GOAL #2   Title Pt will improve ABC by at least 13% in order to demonstrate clinically significant improvement in balance confidence.    Baseline 03/22/20 ABC 39.69%    Time 8    Period Weeks    Status New    Target Date 05/17/20      PT LONG TERM GOAL #3   Title Pt will increase gait speed to at least 1.2 m/s for full community ambulation.    Baseline 03/22/20    Time 8    Period Weeks    Status New    Target Date 05/17/20      PT LONG TERM GOAL #4   Title Pt will improve FGA by at least 4 points to indicate clinically significant improvement in  balance and decreased risk for falls.    Baseline 03/22/20 FGA 19/30    Time 8    Period Weeks    Status New    Target  Date 05/17/20                 Plan - 04/05/20 1345    Clinical Impression Statement PT continued balance, gait and gaze stabilization therapy with good success.  Pt with noted improvements in balance and some habituation with VOR exercises as indicated by improved ability to track objects with head turns with reduced occurrences of dizziness.  Pt utilizes eye compensation strategies for gait with quick turns and head turns with some difficulty noted in utilizing head turns to locate objects during a quick turn d/t learned compensatory strategies.  Pt with some minor gait deviations with head turns and occasional decreased step length, no LOB.  Pt requires supervision with gait activities and CGA for balance activities with no LOB.  PT will continue progression as able.    Personal Factors and Comorbidities Age    Examination-Activity Limitations Reach Overhead;Stairs;Stand;Dressing;Bend;Caring for Others;Carry;Locomotion Level;Squat;Lift;Bathing    Examination-Participation Restrictions Cleaning;Shop;Laundry;Community Activity;Yard Work    [employer] officer Evolving/Moderate complexity    Clinical Decision Making Moderate    Rehab Potential Good    PT Frequency 2x / week    PT Duration 8 weeks    PT Treatment/Interventions ADLs/Self Care Home Management;Moist Heat;Traction;DME Instruction;Gait training;Therapeutic activities;Functional mobility training;Stair training;Therapeutic exercise;Balance training;Neuromuscular re-education;Patient/family education;Manual techniques;Dry needling;Passive range of motion;Energy conservation;Vestibular;Visual/perceptual remediation/compensation;Canalith Repostioning;Cryotherapy;Cognitive remediation    PT Next Visit Plan Continue progression of gaze stabilization exercises, gait training with quick turns, dynamic  balance    PT Home Exercise Plan Marching, hip extension, SLS with counter support, seated VOR cancellation    Consulted and Agree with Plan of Care Patient           Patient will benefit from skilled therapeutic intervention in order to improve the following deficits and impairments:  Abnormal gait, Decreased activity tolerance, Decreased endurance, Decreased knowledge of use of DME, Decreased range of motion, Decreased strength, Dizziness, Hypomobility, Impaired perceived functional ability, Improper body mechanics, Decreased balance, Decreased coordination, Decreased mobility, Difficulty walking, Impaired flexibility, Postural dysfunction, Impaired vision/preception  Visit Diagnosis: Balance problem  Leg weakness, bilateral  Abnormality of gait due to impairment of balance     Problem List There are no problems to display for this patient.  Durwin Reges DPT Chinita Greenland, SPT Durwin Reges 04/05/2020, 3:59 PM  [facility] [address] [place], [address] Phone: [phone]   Fax:  [phone]  Name: Tara Griffin MRN: [id number] Date of Birth: 08-23-1941

## 2020-04-08 ENCOUNTER — Ambulatory Visit: Payer: Medicare Other | Admitting: Physical Therapy

## 2020-04-08 NOTE — Addendum Note (Signed)
Addended by: Kelton Pillar on: 04/08/2020 04:51 PM   Modules accepted: Orders

## 2020-04-09 ENCOUNTER — Ambulatory Visit: Payer: Medicare Other | Admitting: Physical Therapy

## 2020-04-16 ENCOUNTER — Ambulatory Visit: Payer: Medicare Other | Admitting: Physical Therapy

## 2020-04-16 ENCOUNTER — Other Ambulatory Visit: Payer: Self-pay

## 2020-04-16 ENCOUNTER — Encounter: Payer: Self-pay | Admitting: Physical Therapy

## 2020-04-16 DIAGNOSIS — R2689 Other abnormalities of gait and mobility: Secondary | ICD-10-CM

## 2020-04-16 DIAGNOSIS — R29898 Other symptoms and signs involving the musculoskeletal system: Secondary | ICD-10-CM

## 2020-04-16 NOTE — Therapy (Signed)
[facility] [address] [place], [address] Phone: [phone]   Fax:  [phone]  Physical Therapy Treatment  Patient Details  Name: Tara Griffin MRN: [id number] Date of Birth: 1941-06-10 No data recorded  Encounter Date: 04/16/2020   PT End of Session - 04/16/20 1051    Visit Number 5    Number of Visits 16    Date for PT Re-Evaluation 05/18/20    PT Start Time 1045    PT Stop Time 1130    PT Time Calculation (min) 45 min    Equipment Utilized During Treatment Gait belt    Activity Tolerance Patient tolerated treatment well    Behavior During Therapy WFL for tasks assessed/performed           Past Medical History:  Diagnosis Date  . Arthritis   . Depression   . GERD (gastroesophageal reflux disease)   . Hypercholesteremia   . Right knee pain   . Spinal stenosis     Past Surgical History:  Procedure Laterality Date  . ABDOMINAL HYSTERECTOMY  1980   partial  . BREAST CYST ASPIRATION    . CATARACT EXTRACTION W/PHACO Right 05/06/2019   Procedure: CATARACT EXTRACTION PHACO AND INTRAOCULAR LENS PLACEMENT ([facility]) RIGHT;  Surgeon: Birder Robson, MD;  Location: [facility];  Service: Ophthalmology;  Laterality: Right;  . CATARACT EXTRACTION W/PHACO Left 05/27/2019   Procedure: CATARACT EXTRACTION PHACO AND INTRAOCULAR LENS PLACEMENT ([facility]) LEFT  00:56.3  18.2%  10.24;  Surgeon: Birder Robson, MD;  Location: [facility];  Service: Ophthalmology;  Laterality: Left;  . OPEN REDUCTION INTERNAL FIXATION (ORIF) DISTAL RADIAL FRACTURE Left 02/26/2020   Procedure: LEFT OPEN REDUCTION INTERNAL FIXATION (ORIF) DISTAL RADIAL FRACTURE;  Surgeon: Hessie Knows, MD;  Location: [facility];  Service: Orthopedics;  Laterality: Left;    There were no vitals filed for this visit.   Subjective Assessment - 04/16/20 1048    Subjective Pt reports no pain this morning, received injection last week.  Reports to PT with no brace on L wrist, discontinued per dr's orders.  Pt reports experiencing R knee pain after last session that she believes is a result of quick turn/pivot movements; pain subsides after 24 hours, no pain reported today.    Pertinent History Pt is a 79 y.o retired female reporting to PT with c/o impairments of balance and gait.  Pt 4 weeks s/p injurious FOOSH on concrete resulting in a L wrist fx, no other reported injuries.  Pt reports this is one of 2 falls in the past 6 months with 2 additional near fall also reported.  Pt also had an injurious fall last year that resulted in a concussion and cracked rib.  Pt reports falls often occur to her left with quick turns and changes in direction.  Pt expresses feeling faint/dizzy with standing up from a bent over position and when rising from the bed; compensates in the morning by sitting EOB until feeling subsides, and avoids bending activities.  Pt frequently expresses fear of falling that is interfering with her ability to complete ADLs and fully participate in the community d/t avoidance behavior.  Pt says she has fear of falling with walking and that she walks slower as a result of fear and has increased difficulty/fear with stairs, inclines, and uneven surfaces.  Pt lives alone in a single story home with no stairs; reports that she avoids stairs d/t fear of falling.  Pt's husband passed away  last year to whom she was the primary caregiver; expresses that she has been having a hard time since his passing.  Pt reports having a good family and community support system, including her son and daughter in law, and her neighbors.  Pt has a 79 y.o. great grandson she would like to be able to care for without fear of falling; pt would also like to decrease her fall risk.  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  Pt also with hx of R knee pain; received injection prior to most recent fall.  Pt denies N/V, B&B  changes, unexplained weight fluctuation, saddle paresthesia, fever, night sweats, or unrelenting night pain at this time.    Limitations Walking;Lifting;House hold activities;Standing    How long can you sit comfortably? Unlimited    How long can you stand comfortably? Unlimited    How long can you walk comfortably? Unlimited but with fear of falling; avoids    Patient Stated Goals decrease fall risk    Currently in Pain? No/denies             Neuro -Seated VOR cancellation 3x30 seconds, saccadic movements noted, pt unable to correct but improves with cueing to maintain eye contact on thumbs -Standing modified tandem on foam with VORx1 x30 sec B with CGA for safety -Standing tandem on foam with VORx1 x30 sec B with CGA occasional UE support for balance -Standing on foam with trunk rotation with ball retrieval and busy traffic background 2x10, and 2x10 in modified tandem on foam B; for head turns and gaze stabilization with moving background, increased postural sway in tandem standing with intermittent use of UE support for balance and CGA for safety -Alternating cone tapping on foam with cones on 3" step for increased hip flexion x10 B with 2 taps (anterior, across midline), x10 B with 3 taps (anterior, across, anterior) with cueing for decreased speed for increased control and time in SLS, cueing for light tap and reduce posterior leaning, minimal use of hip rocker for balance correction noted -Gait in clinic 4x41ft with navigation around cones and horizontal head turns x6 with cueing to reduce trunk rotation compensation for increased head turn with movement to activate vestibular system -Tandem walking on foam 4x23ft with cueing for heel toe walking      PT Education - 04/16/20 1051    Education Details Therex form/mechanics    Person(s) Educated Patient    Methods Explanation;Demonstration;Verbal cues    Comprehension Verbalized understanding;Returned demonstration            PT  Short Term Goals - 03/23/20 1156      PT SHORT TERM GOAL #1   Title Pt will be independent with HEP to improve strength and balance in order to decrease fall risk and improve function at home and in the community.    Baseline 03/22/20 Pt educated on HEP and handout given    Time 2    Period Weeks    Status New    Target Date 04/05/20             PT Long Term Goals - 03/23/20 1305      PT LONG TERM GOAL #1   Title Pt will increase FOTO score to 64 to indicate improved functional mobility with ADLs.    Baseline 03/22/20 FOTO 56    Time 8    Period Weeks    Status New    Target Date 05/17/20  PT LONG TERM GOAL #2   Title Pt will improve ABC by at least 13% in order to demonstrate clinically significant improvement in balance confidence.    Baseline 03/22/20 ABC 39.69%    Time 8    Period Weeks    Status New    Target Date 05/17/20      PT LONG TERM GOAL #3   Title Pt will increase gait speed to at least 1.2 m/s for full community ambulation.    Baseline 03/22/20    Time 8    Period Weeks    Status New    Target Date 05/17/20      PT LONG TERM GOAL #4   Title Pt will improve FGA by at least 4 points to indicate clinically significant improvement in balance and decreased risk for falls.    Baseline 03/22/20 FGA 19/30    Time 8    Period Weeks    Status New    Target Date 05/17/20                 Plan - 04/16/20 1133    Clinical Impression Statement PT continued POC for balance and vestibular rehabilitaion with good success.  Pt tolerated VOR cancellation and gaze stabilization exercises with no reported dizziness, and some increased difficulty with balance in tandem standing.  Pt requires CGA with balance therex and is able to correct errors with knee/ankle rockers and stepping strategies; pt with difficulty using hip rocker and demonstrates a posterior lean with SLB activities, no LOB.  PT will continue progression as able.    Personal Factors and Comorbidities  Age    Examination-Activity Limitations Reach Overhead;Stairs;Stand;Dressing;Bend;Caring for Others;Carry;Locomotion Level;Squat;Lift;Bathing    Examination-Participation Restrictions Cleaning;Shop;Laundry;Community Activity;Yard Work    [employer] Evolving/Moderate complexity    Clinical Decision Making Moderate    Rehab Potential Good    PT Frequency 2x / week    PT Duration 8 weeks    PT Treatment/Interventions ADLs/Self Care Home Management;Moist Heat;Traction;DME Instruction;Gait training;Therapeutic activities;Functional mobility training;Stair training;Therapeutic exercise;Balance training;Neuromuscular re-education;Patient/family education;Manual techniques;Dry needling;Passive range of motion;Energy conservation;Vestibular;Visual/perceptual remediation/compensation;Canalith Repostioning;Cryotherapy;Cognitive remediation    PT Next Visit Plan Continue progression of gaze stabilization exercises, gait training with head turns, dynamic balance    PT Home Exercise Plan Marching, hip extension, SLS with counter support, seated VOR cancellation    Consulted and Agree with Plan of Care Patient           Patient will benefit from skilled therapeutic intervention in order to improve the following deficits and impairments:  Abnormal gait, Decreased activity tolerance, Decreased endurance, Decreased knowledge of use of DME, Decreased range of motion, Decreased strength, Dizziness, Hypomobility, Impaired perceived functional ability, Improper body mechanics, Decreased balance, Decreased coordination, Decreased mobility, Difficulty walking, Impaired flexibility, Postural dysfunction, Impaired vision/preception  Visit Diagnosis: Balance problem  Leg weakness, bilateral  Abnormality of gait due to impairment of balance     Problem List There are no problems to display for this patient.   Durwin Reges DPT Chinita Greenland, SPT Durwin Reges 04/16/2020, 11:43  AM  [facility] [address] [place], [address] Phone: [phone]   Fax:  [phone]  Name: Kathrynne Kulinski MRN: [id number] Date of Birth: Feb 14, 1941

## 2020-04-21 ENCOUNTER — Ambulatory Visit: Payer: Medicare Other | Admitting: Physical Therapy

## 2020-04-23 ENCOUNTER — Ambulatory Visit: Payer: Medicare Other | Admitting: Physical Therapy

## 2020-04-23 ENCOUNTER — Other Ambulatory Visit: Payer: Self-pay

## 2020-04-23 ENCOUNTER — Encounter: Payer: Self-pay | Admitting: Physical Therapy

## 2020-04-23 DIAGNOSIS — R2689 Other abnormalities of gait and mobility: Secondary | ICD-10-CM | POA: Diagnosis not present

## 2020-04-23 DIAGNOSIS — R29898 Other symptoms and signs involving the musculoskeletal system: Secondary | ICD-10-CM

## 2020-04-23 NOTE — Therapy (Signed)
[facility] [address] [place], [address] Phone: [phone]   Fax:  [phone]  Physical Therapy Treatment  Patient Details  Name: Tara Griffin MRN: [id number] Date of Birth: Nov 07, 1940 No data recorded  Encounter Date: 04/23/2020   PT End of Session - 04/23/20 1050    Visit Number 6    Number of Visits 16    Date for PT Re-Evaluation 05/18/20    PT Start Time 1045    PT Stop Time 1125    PT Time Calculation (min) 40 min    Equipment Utilized During Treatment Gait belt    Activity Tolerance Patient tolerated treatment well    Behavior During Therapy [facility] for tasks assessed/performed           Past Medical History:  Diagnosis Date  . Arthritis   . Depression   . GERD (gastroesophageal reflux disease)   . Hypercholesteremia   . Right knee pain   . Spinal stenosis     Past Surgical History:  Procedure Laterality Date  . ABDOMINAL HYSTERECTOMY  1980   partial  . BREAST CYST ASPIRATION    . CATARACT EXTRACTION W/PHACO Right 05/06/2019   Procedure: CATARACT EXTRACTION PHACO AND INTRAOCULAR LENS PLACEMENT ([facility]) RIGHT;  Surgeon: Birder Robson, MD;  Location: [facility];  Service: Ophthalmology;  Laterality: Right;  . CATARACT EXTRACTION W/PHACO Left 05/27/2019   Procedure: CATARACT EXTRACTION PHACO AND INTRAOCULAR LENS PLACEMENT ([facility]) LEFT  00:56.3  18.2%  10.24;  Surgeon: Birder Robson, MD;  Location: [facility];  Service: Ophthalmology;  Laterality: Left;  . OPEN REDUCTION INTERNAL FIXATION (ORIF) DISTAL RADIAL FRACTURE Left 02/26/2020   Procedure: LEFT OPEN REDUCTION INTERNAL FIXATION (ORIF) DISTAL RADIAL FRACTURE;  Surgeon: Hessie Knows, MD;  Location: [facility];  Service: Orthopedics;  Laterality: Left;    There were no vitals filed for this visit.   Subjective Assessment - 04/23/20 1046    Subjective Pt reports pain in R back (5/10) and R knee (4/10). Pt is concerned  that exercises are making back/knee pain worse.  Pt reports no dizziness currently, only when she gets up in the morning.  Compliance with HEP.    Pertinent History Pt is a 79 y.o retired female reporting to PT with c/o impairments of balance and gait.  Pt 4 weeks s/p injurious FOOSH on concrete resulting in a L wrist fx, no other reported injuries.  Pt reports this is one of 2 falls in the past 6 months with 2 additional near fall also reported.  Pt also had an injurious fall last year that resulted in a concussion and cracked rib.  Pt reports falls often occur to her left with quick turns and changes in direction.  Pt expresses feeling faint/dizzy with standing up from a bent over position and when rising from the bed; compensates in the morning by sitting EOB until feeling subsides, and avoids bending activities.  Pt frequently expresses fear of falling that is interfering with her ability to complete ADLs and fully participate in the community d/t avoidance behavior.  Pt says she has fear of falling with walking and that she walks slower as a result of fear and has increased difficulty/fear with stairs, inclines, and uneven surfaces.  Pt lives alone in a single story home with no stairs; reports that she avoids stairs d/t fear of falling.  Pt's husband passed away last year to whom she was the primary caregiver; expresses that she has  been having a hard time since his passing.  Pt reports having a good family and community support system, including her son and daughter in law, and her neighbors.  Pt has a 29 y.o. great grandson she would like to be able to care for without fear of falling; pt would also like to decrease her fall risk.  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  Pt also with hx of R knee pain; received injection prior to most recent fall.  Pt denies N/V, B&B changes, unexplained weight fluctuation, saddle paresthesia, fever, night sweats, or unrelenting  night pain at this time.    Limitations Walking;Lifting;House hold activities;Standing    How long can you sit comfortably? Unlimited    How long can you stand comfortably? Unlimited    How long can you walk comfortably? Unlimited but with fear of falling; avoids    Patient Stated Goals decrease fall risk    Currently in Pain? Yes    Pain Score 5     Pain Location Back    Pain Orientation Lower           Neuro Gait 2x165ft of amb with head turns, with gait deviations noted with L head turns, no LOB Gait 2x134ft of amb with vertical head turns, minimal gait deviations noted and decreased step length, no LOB Gait 2x183ft of amb with trunk rotation and head turns to grab and hand ball to PT Ball catch/toss x10  Ball catch/tossin modified tandem 2x10  Ball catch/tosson foam x10 Ball catch/tossin modified tandem on foam 2x10 Ball catch/tosswith head turn and trunk rotation 2x10 B with increased difficulty with turning to L side to catch STS x10 with cueing to reduce knee valgus    PT Education - 04/23/20 1050    Education Details Therex form/mechanics    Person(s) Educated Patient    Methods Explanation;Demonstration;Verbal cues    Comprehension Verbalized understanding;Returned demonstration            PT Short Term Goals - 03/23/20 1156      PT SHORT TERM GOAL #1   Title Pt will be independent with HEP to improve strength and balance in order to decrease fall risk and improve function at home and in the community.    Baseline 03/22/20 Pt educated on HEP and handout given    Time 2    Period Weeks    Status New    Target Date 04/05/20             PT Long Term Goals - 03/23/20 1305      PT LONG TERM GOAL #1   Title Pt will increase FOTO score to 64 to indicate improved functional mobility with ADLs.    Baseline 03/22/20 FOTO 56    Time 8    Period Weeks    Status New    Target Date 05/17/20      PT LONG TERM GOAL #2   Title Pt will improve ABC by at least 13%  in order to demonstrate clinically significant improvement in balance confidence.    Baseline 03/22/20 ABC 39.69%    Time 8    Period Weeks    Status New    Target Date 05/17/20      PT LONG TERM GOAL #3   Title Pt will increase gait speed to at least 1.2 m/s for full community ambulation.    Baseline 03/22/20    Time 8  Period Weeks    Status New    Target Date 05/17/20      PT LONG TERM GOAL #4   Title Pt will improve FGA by at least 4 points to indicate clinically significant improvement in balance and decreased risk for falls.    Baseline 03/22/20 FGA 19/30    Time 8    Period Weeks    Status New    Target Date 05/17/20                 Plan - 04/23/20 1129    Clinical Impression Statement PT continued POC for balance and vestibular rehabilitation with good success.  Pt demonstrates increased gait deviation and postural sway with L head turns, pt reports it feels more difficult on the L; supervision for safety and no LOB.  Pt with no reports of dizziness throughout session and no increase in back/knee pain.  Pt with good use of rocker moments with balance therex.  PT will continue progression as able.    Personal Factors and Comorbidities Age    Examination-Activity Limitations Reach Overhead;Stairs;Stand;Dressing;Bend;Caring for Others;Carry;Locomotion Level;Squat;Lift;Bathing    Examination-Participation Restrictions Cleaning;Shop;Laundry;Community Activity;Yard Work    [employer] officer Evolving/Moderate complexity    Clinical Decision Making Moderate    Rehab Potential Good    PT Frequency 2x / week    PT Duration 8 weeks    PT Treatment/Interventions ADLs/Self Care Home Management;Moist Heat;Traction;DME Instruction;Gait training;Therapeutic activities;Functional mobility training;Stair training;Therapeutic exercise;Balance training;Neuromuscular re-education;Patient/family education;Manual techniques;Dry needling;Passive range of motion;Energy  conservation;Vestibular;Visual/perceptual remediation/compensation;Canalith Repostioning;Cryotherapy;Cognitive remediation    PT Next Visit Plan Gaze stabilization with head turns, gait with head turns, dynamic balance    PT Home Exercise Plan Marching, hip extension, SLS with counter support, seated VOR cancellation    Consulted and Agree with Plan of Care Patient           Patient will benefit from skilled therapeutic intervention in order to improve the following deficits and impairments:  Abnormal gait, Decreased activity tolerance, Decreased endurance, Decreased knowledge of use of DME, Decreased range of motion, Decreased strength, Dizziness, Hypomobility, Impaired perceived functional ability, Improper body mechanics, Decreased balance, Decreased coordination, Decreased mobility, Difficulty walking, Impaired flexibility, Postural dysfunction, Impaired vision/preception  Visit Diagnosis: Balance problem  Leg weakness, bilateral  Abnormality of gait due to impairment of balance     Problem List There are no problems to display for this patient.   Durwin Reges DPT Chinita Greenland, SPT Durwin Reges 04/23/2020, 11:33 AM  [facility] [address], [place], [address] Phone: [phone]   Fax:  [phone]  Name: Jamira Barfuss MRN: [id number] Date of Birth: Dec 01, 1940

## 2020-04-27 ENCOUNTER — Ambulatory Visit: Payer: Medicare Other | Attending: Orthopedic Surgery | Admitting: Physical Therapy

## 2020-04-27 ENCOUNTER — Other Ambulatory Visit: Payer: Self-pay

## 2020-04-27 ENCOUNTER — Encounter: Payer: Self-pay | Admitting: Physical Therapy

## 2020-04-27 DIAGNOSIS — R2689 Other abnormalities of gait and mobility: Secondary | ICD-10-CM | POA: Diagnosis present

## 2020-04-27 DIAGNOSIS — R29898 Other symptoms and signs involving the musculoskeletal system: Secondary | ICD-10-CM | POA: Insufficient documentation

## 2020-04-27 NOTE — Therapy (Signed)
[facility] [address] [place], [address] Phone: [phone]   Fax:  [phone]  Physical Therapy Treatment/DC Summary Reporting Period 03/22/20 - 04/27/20  Patient Details  Name: Tara Griffin MRN: [id number] Date of Birth: Sep 13, 1941 No data recorded  Encounter Date: 04/27/2020   PT End of Session - 04/27/20 1522    Visit Number 7    Number of Visits 16    Date for PT Re-Evaluation 05/18/20    PT Start Time 0319    PT Stop Time 0400    PT Time Calculation (min) 41 min    Equipment Utilized During Treatment Gait belt    Activity Tolerance Patient tolerated treatment well    Behavior During Therapy [facility] for tasks assessed/performed           Past Medical History:  Diagnosis Date  . Arthritis   . Depression   . GERD (gastroesophageal reflux disease)   . Hypercholesteremia   . Right knee pain   . Spinal stenosis     Past Surgical History:  Procedure Laterality Date  . ABDOMINAL HYSTERECTOMY  1980   partial  . BREAST CYST ASPIRATION    . CATARACT EXTRACTION W/PHACO Right 05/06/2019   Procedure: CATARACT EXTRACTION PHACO AND INTRAOCULAR LENS PLACEMENT ([place]) RIGHT;  Surgeon: Birder Robson, MD;  Location: [place];  Service: Ophthalmology;  Laterality: Right;  . CATARACT EXTRACTION W/PHACO Left 05/27/2019   Procedure: CATARACT EXTRACTION PHACO AND INTRAOCULAR LENS PLACEMENT ([facility]) LEFT  00:56.3  18.2%  10.24;  Surgeon: Birder Robson, MD;  Location: [place];  Service: Ophthalmology;  Laterality: Left;  . OPEN REDUCTION INTERNAL FIXATION (ORIF) DISTAL RADIAL FRACTURE Left 02/26/2020   Procedure: LEFT OPEN REDUCTION INTERNAL FIXATION (ORIF) DISTAL RADIAL FRACTURE;  Surgeon: Hessie Knows, MD;  Location: [facility];  Service: Orthopedics;  Laterality: Left;    There were no vitals filed for this visit.   Subjective Assessment - 04/27/20 1521    Subjective Reports no dizzy spells  since last visit. Reports continued R low back and knee pain, but that she is getting an injection next week for this.    Pertinent History Pt is a 79 y.o retired female reporting to PT with c/o impairments of balance and gait.  Pt 4 weeks s/p injurious FOOSH on concrete resulting in a L wrist fx, no other reported injuries.  Pt reports this is one of 2 falls in the past 6 months with 2 additional near fall also reported.  Pt also had an injurious fall last year that resulted in a concussion and cracked rib.  Pt reports falls often occur to her left with quick turns and changes in direction.  Pt expresses feeling faint/dizzy with standing up from a bent over position and when rising from the bed; compensates in the morning by sitting EOB until feeling subsides, and avoids bending activities.  Pt frequently expresses fear of falling that is interfering with her ability to complete ADLs and fully participate in the community d/t avoidance behavior.  Pt says she has fear of falling with walking and that she walks slower as a result of fear and has increased difficulty/fear with stairs, inclines, and uneven surfaces.  Pt lives alone in a single story home with no stairs; reports that she avoids stairs d/t fear of falling.  Pt's husband passed away last year to whom she was the primary caregiver; expresses that she has been having a hard time since his  passing.  Pt reports having a good family and community support system, including her son and daughter in law, and her neighbors.  Pt has a 41 y.o. great grandson she would like to be able to care for without fear of falling; pt would also like to decrease her fall risk.  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  Pt also with hx of R knee pain; received injection prior to most recent fall.  Pt denies N/V, B&B changes, unexplained weight fluctuation, saddle paresthesia, fever, night sweats, or unrelenting night pain at this time.     Limitations Walking;Lifting;House hold activities;Standing    How long can you sit comfortably? Unlimited    How long can you stand comfortably? Unlimited    How long can you walk comfortably? Unlimited but with fear of falling; avoids    Patient Stated Goals decrease fall risk           Neuro Nustep L3 15mns UE and LE setting 9 for gentle lumbar motion for decreased pain response; educaiton on pain cycle and science Gait [id number]/ 2x 90d turns each trial with cuing to prevent shuffling once at cone; x1 of person purposefully walking in front of patient with patient able to adjust accordingly  Gait 2x [id number] amb with maintained horizontal head turn throughout entire ambulation with supervision for safety  Gait 2x [id number] PT occluding vision rhythmically with dark paper in front of patient   FGA test with supervision  Education on balance confidence and graded exposure to balance challenges (walking through crowded store) to increase this slowly and to continue to move as opposed to fear-avoidance to increase ind and function with verbalized understanding                          PT Education - 04/27/20 1522    Education Details therex form, neuro-re-ed    Person(s) Educated Patient    Methods Explanation;Demonstration;Verbal cues    Comprehension Verbalized understanding;Returned demonstration;Verbal cues required            PT Short Term Goals - 03/23/20 1156      PT SHORT TERM GOAL #1   Title Pt will be independent with HEP to improve strength and balance in order to decrease fall risk and improve function at home and in the community.    Baseline 03/22/20 Pt educated on HEP and handout given    Time 2    Period Weeks    Status New    Target Date 04/05/20             PT Long Term Goals - 04/27/20 1535      PT LONG TERM GOAL #1   Title Pt will increase FOTO score to 64 to indicate improved functional mobility with ADLs.    Baseline 04/27/20 72  03/22/20 FOTO 56    Time 8    Period Weeks    Status Achieved      PT LONG TERM GOAL #2   Title Pt will improve ABC by at least 13% in order to demonstrate clinically significant improvement in balance confidence.    Baseline 04/27/20 84.4% 03/22/20 ABC 39.69%    Time 8    Period Weeks    Status Achieved      PT LONG TERM GOAL #3   Title Pt will increase gait speed to at least 1.2 m/s for full community ambulation.  Baseline 04/27/20 1.47ms 03/22/20 1.069m    Time 8    Period Weeks    Status Achieved      PT LONG TERM GOAL #4   Title Pt will improve FGA by at least 4 points to indicate clinically significant improvement in balance and decreased risk for falls.    Baseline 04/27/20 29/30 03/22/20 FGA 19/30    Time 8    Period Weeks    Status Achieved                 Plan - 04/27/20 1600    Clinical Impression Statement PT continued POC for increased balance and confedience with visual and unexpected disruptions during ambulation, as patient reports this is the only thing that she continues to struggle with. PT re-assessed patient goals this session where patient has met all goals to safely d/c. PT educated patient on continued graded exposure and PT potentially for LBP should pain not subside with medical intervention with patient verbalizing understanding. PT answered all patient questions pertaining to d/c and gave pt clinic contact info should any further questions/concerns arise. Pt to d/c PT.    Personal Factors and Comorbidities Age    Examination-Activity Limitations Reach Overhead;Stairs;Stand;Dressing;Bend;Caring for Others;Carry;Locomotion Level;Squat;Lift;Bathing    Examination-Participation Restrictions Cleaning;Shop;Laundry;Community Activity;Yard Work    [employer] officervolving/Moderate complexity    Clinical Decision Making Moderate    Rehab Potential Good    PT Frequency 2x / week    PT Duration 8 weeks    PT Treatment/Interventions ADLs/Self  Care Home Management;Moist Heat;Traction;DME Instruction;Gait training;Therapeutic activities;Functional mobility training;Stair training;Therapeutic exercise;Balance training;Neuromuscular re-education;Patient/family education;Manual techniques;Dry needling;Passive range of motion;Energy conservation;Vestibular;Visual/perceptual remediation/compensation;Canalith Repostioning;Cryotherapy;Cognitive remediation    PT Next Visit Plan Gaze stabilization with head turns, gait with head turns, dynamic balance    PT Home Exercise Plan Marching, hip extension, SLS with counter support, seated VOR cancellation    Consulted and Agree with Plan of Care Patient           Patient will benefit from skilled therapeutic intervention in order to improve the following deficits and impairments:  Abnormal gait, Decreased activity tolerance, Decreased endurance, Decreased knowledge of use of DME, Decreased range of motion, Decreased strength, Dizziness, Hypomobility, Impaired perceived functional ability, Improper body mechanics, Decreased balance, Decreased coordination, Decreased mobility, Difficulty walking, Impaired flexibility, Postural dysfunction, Impaired vision/preception  Visit Diagnosis: Balance problem  Leg weakness, bilateral  Abnormality of gait due to impairment of balance     Problem List There are no problems to display for this patient.  ChDurwin RegesPT ChDurwin Reges/11/2019, 4:03 PM  [facility] [address]hone: [phone] Fax:  [phone]: TrEmmogene SimsonRN: [id number] of Birth: 2/09-Mar-1941

## 2020-05-04 ENCOUNTER — Ambulatory Visit: Payer: Medicare Other | Admitting: Physical Therapy

## 2020-05-11 ENCOUNTER — Encounter: Payer: Medicare Other | Admitting: Physical Therapy

## 2020-05-13 ENCOUNTER — Encounter: Payer: Medicare Other | Admitting: Physical Therapy

## 2020-05-18 ENCOUNTER — Encounter: Payer: Medicare Other | Admitting: Physical Therapy

## 2020-05-21 ENCOUNTER — Encounter: Payer: Medicare Other | Admitting: Physical Therapy

## 2020-05-24 ENCOUNTER — Encounter: Payer: Medicare Other | Admitting: Physical Therapy

## 2020-07-01 ENCOUNTER — Encounter: Payer: Medicare Other | Admitting: Dermatology

## 2020-07-14 ENCOUNTER — Other Ambulatory Visit: Payer: Self-pay | Admitting: Dermatology

## 2020-07-14 ENCOUNTER — Ambulatory Visit: Payer: Medicare Other | Admitting: Dermatology

## 2020-07-14 ENCOUNTER — Other Ambulatory Visit: Payer: Self-pay

## 2020-07-14 DIAGNOSIS — L814 Other melanin hyperpigmentation: Secondary | ICD-10-CM

## 2020-07-14 DIAGNOSIS — L820 Inflamed seborrheic keratosis: Secondary | ICD-10-CM | POA: Diagnosis not present

## 2020-07-14 DIAGNOSIS — D229 Melanocytic nevi, unspecified: Secondary | ICD-10-CM

## 2020-07-14 DIAGNOSIS — Z808 Family history of malignant neoplasm of other organs or systems: Secondary | ICD-10-CM

## 2020-07-14 DIAGNOSIS — D2372 Other benign neoplasm of skin of left lower limb, including hip: Secondary | ICD-10-CM

## 2020-07-14 DIAGNOSIS — D1800 Hemangioma unspecified site: Secondary | ICD-10-CM

## 2020-07-14 DIAGNOSIS — B351 Tinea unguium: Secondary | ICD-10-CM | POA: Diagnosis not present

## 2020-07-14 DIAGNOSIS — Z1283 Encounter for screening for malignant neoplasm of skin: Secondary | ICD-10-CM | POA: Diagnosis not present

## 2020-07-14 DIAGNOSIS — L578 Other skin changes due to chronic exposure to nonionizing radiation: Secondary | ICD-10-CM

## 2020-07-14 DIAGNOSIS — D239 Other benign neoplasm of skin, unspecified: Secondary | ICD-10-CM

## 2020-07-14 DIAGNOSIS — L821 Other seborrheic keratosis: Secondary | ICD-10-CM

## 2020-07-14 DIAGNOSIS — L601 Onycholysis: Secondary | ICD-10-CM | POA: Diagnosis not present

## 2020-07-14 NOTE — Patient Instructions (Signed)
Recommend daily broad spectrum sunscreen SPF 30+ to sun-exposed areas, reapply every 2 hours as needed. Call for new or changing lesions.  

## 2020-07-14 NOTE — Progress Notes (Signed)
   Follow-Up Visit   Subjective  Tara Griffin is a 79 y.o. female who presents for the following: TBSE.  Patient here for full body skin exam and skin cancer screening. Patient would like to have her finger and toe nails evaluated today. Patient has no history of skin cancers. Patient's son has h/o of MM.  The following portions of the chart were reviewed this encounter and updated as appropriate:  Tobacco  Allergies  Meds  Problems  Med Hx  Surg Hx  Fam Hx      Review of Systems:  No other skin or systemic complaints except as noted in HPI or Assessment and Plan.  Objective  Well appearing patient in no apparent distress; mood and affect are within normal limits.  A full examination was performed including scalp, head, eyes, ears, nose, lips, neck, chest, axillae, abdomen, back, buttocks, bilateral upper extremities, bilateral lower extremities, hands, feet, fingers, toes, fingernails, and toenails. All findings within normal limits unless otherwise noted below.  Objective  Left Hallux Toe Nail Plate: Nail dystrophy  Objective  Left 4th Finger Hyponychium: Multiple fingernails with onycholysis  Objective  Left Hip: Erythematous keratotic or waxy stuck-on papule or plaque.   Objective  Left calf: Firm pink/brown papulenodule with dimple sign.    Assessment & Plan  Onychomycosis Left Hallux Toe Nail Plate  Will send in nail clipping for culture.  Discussed pending culture, will plan pulse PO terbinafine therapy if labs ok and no medication interactions. Otherwise would select topical.  Culture, Fungus with Smear - Left Hallux Toe Nail Plate  Onycholysis Left 4th Finger Hyponychium  Favor secondary to onychomycosis.  will send in nail clipping for culture and consider p.o. terbinafine therapy if culture positive and labs unremarkable  Culture, Fungus with Smear - Left 4th Finger Hyponychium  Inflamed seborrheic keratosis Left Hip  Defers treatment today.  Benign-appearing.  Observation.  Call clinic for new or changing lesions.    Dermatofibroma Left calf  Benign-appearing.  Observation.  Call clinic for new or changing lesions.  Recommend daily use of broad spectrum spf 30+ sunscreen to sun-exposed areas.     Lentigines - Scattered tan macules - Discussed due to sun exposure - Benign, observe - Call for any changes  Seborrheic Keratoses - Stuck-on, waxy, tan-brown papules and plaques  - Discussed benign etiology and prognosis. - Observe - Call for any changes  Melanocytic Nevi - Tan-brown and/or pink-flesh-colored symmetric macules and papules - Benign appearing on exam today - Observation - Call clinic for new or changing moles - Recommend daily use of broad spectrum spf 30+ sunscreen to sun-exposed areas.   Hemangiomas - Red papules - Discussed benign nature - Observe - Call for any changes  Actinic Damage - diffuse scaly erythematous macules with underlying dyspigmentation - Recommend daily broad spectrum sunscreen SPF 30+ to sun-exposed areas, reapply every 2 hours as needed.  - Call for new or changing lesions.  Skin cancer screening performed today.   Return in about 6 weeks (around 08/25/2020) for nail fungus.  IDonzetta Kohut, CMA, am acting as scribe for Forest Gleason, MD .  Documentation: I have reviewed the above documentation for accuracy and completeness, and I agree with the above.  Forest Gleason, MD

## 2020-07-21 ENCOUNTER — Encounter: Payer: Self-pay | Admitting: Dermatology

## 2020-08-05 ENCOUNTER — Encounter: Payer: Medicare Other | Admitting: Dermatology

## 2020-08-05 LAB — FUNGUS CULTURE W SMEAR

## 2020-08-11 NOTE — Progress Notes (Signed)
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  I reviewed her CMP from September which was unremarkable.  Based on this and our discussion in clinic, I would recommend starting terbinafine pulse therapy - Terbinafine 250 mg daily for 7 days days, repeat every other month for 7 months (Take for 7 days at the beginning of months 1, 3, 5, and 7).    MAs please call patient to review results and review the following with patient. Thank you!  Terbinafine is an anti-fungal medicine that can be applied to the skin (over the counter) or taken by mouth (prescription) to treat fungal infections. The pill version is often used to treat fungal infections of the nails or scalp. While most people do not have any side effects from taking terbinafine pills, some possible side effects of the medicine can include taste changes, headache, loss of smell, vision changes, nausea, vomiting, or diarrhea.   Rare side effects can include irritation of the liver, allergic reaction, or decrease in blood counts (which may show up as not feeling well or developing an infection). If you are concerned about any of these side effects, please stop the medicine and call your doctor, or in the case of an emergency such as feeling very unwell, seek immediate medical care.   If she would like to proceed with treatment, we will send in 28 tablets of terbinafine.  Please advise patient that it can take several months for the toenail to grow out normally.

## 2020-08-16 ENCOUNTER — Other Ambulatory Visit: Payer: Self-pay

## 2020-08-16 ENCOUNTER — Telehealth: Payer: Self-pay

## 2020-08-16 DIAGNOSIS — B351 Tinea unguium: Secondary | ICD-10-CM

## 2020-08-16 MED ORDER — TERBINAFINE HCL 250 MG PO TABS: 250.0000 mg | ORAL_TABLET | Freq: Every day | ORAL | 0 refills | Status: DC

## 2020-08-16 NOTE — Telephone Encounter (Signed)
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  I reviewed her CMP from September which was unremarkable.  Based on this and our discussion in clinic, I would recommend starting terbinafine pulse therapy - Terbinafine 250 mg daily for 7 days days, repeat every other month for 7 months (Take for 7 days at the beginning of months 1, 3, 5, and 7).    MAs please call patient to review results and review the following with patient. Thank you!  Terbinafine is an anti-fungal medicine that can be applied to the skin (over the counter) or taken by mouth (prescription) to treat fungal infections. The pill version is often used to treat fungal infections of the nails or scalp. While most people do not have any side effects from taking terbinafine pills, some possible side effects of the medicine can include taste changes, headache, loss of smell, vision changes, nausea, vomiting, or diarrhea.   Rare side effects can include irritation of the liver, allergic reaction, or decrease in blood counts (which may show up as not feeling well or developing an infection). If you are concerned about any of these side effects, please stop the medicine and call your doctor, or in the case of an emergency such as feeling very unwell, seek immediate medical care.   If she would like to proceed with treatment, we will send in 28 tablets of terbinafine.  Please advise patient that it can take several months for the toenail to grow out normally.

## 2020-08-16 NOTE — Telephone Encounter (Signed)
Patient advised culture results. Discussed treatment with terbinafine and how to take for pulse dosing at months 1, 3, 5 and 7. Patient is scheduled 08/26/20 for follow up but will only have had 2 days of medicine by then. Should we reschedule her follow up? JS

## 2020-08-16 NOTE — Telephone Encounter (Signed)
Yes, please. Recommend rescheduling for 3 months from now. Thank you!

## 2020-08-17 NOTE — Telephone Encounter (Signed)
Left pt message to call office and reschedule appt for 3 month follow up, JS

## 2020-08-26 ENCOUNTER — Ambulatory Visit: Payer: Medicare Other | Admitting: Dermatology

## 2020-12-02 ENCOUNTER — Ambulatory Visit: Payer: Medicare Other | Admitting: Dermatology

## 2020-12-02 ENCOUNTER — Other Ambulatory Visit: Payer: Self-pay

## 2020-12-02 DIAGNOSIS — L649 Androgenic alopecia, unspecified: Secondary | ICD-10-CM

## 2020-12-02 DIAGNOSIS — L601 Onycholysis: Secondary | ICD-10-CM

## 2020-12-02 DIAGNOSIS — D229 Melanocytic nevi, unspecified: Secondary | ICD-10-CM | POA: Diagnosis not present

## 2020-12-02 DIAGNOSIS — B351 Tinea unguium: Secondary | ICD-10-CM | POA: Diagnosis not present

## 2020-12-02 NOTE — Patient Instructions (Addendum)
Terbinafine Counseling  Terbinafine is an anti-fungal medicine that can be applied to the skin (over the counter) or taken by mouth (prescription) to treat fungal infections. The pill version is often used to treat fungal infections of the nails or scalp. While most people do not have any side effects from taking terbinafine pills, some possible side effects of the medicine can include taste changes, headache, loss of smell, vision changes, nausea, vomiting, or diarrhea.   Rare side effects can include irritation of the liver, allergic reaction, or decrease in blood counts (which may show up as not feeling well or developing an infection). If you are concerned about any of these side effects, please stop the medicine and call your doctor, or in the case of an emergency such as feeling very unwell, seek immediate medical care.    Recommend minoxidil 5% (Rogaine for men) solution or foam to be applied to the scalp and left in. This should ideally be used twice daily for best results but it helps with hair regrowth when used at least three times per week. Rogaine initially can cause increased hair shedding for the first few weeks but this will stop with continued use. In studies, people who used minoxidil (Rogaine) for at least 6 months had thicker hair than people who did not. Minoxidil topical (Rogaine) only works as long as it continues to be used. If if it is no longer used then the hair it has been helping to regrow can fall out. Minoxidil topical (Rogaine) cause increased facial hair growth.

## 2020-12-02 NOTE — Progress Notes (Signed)
Follow-Up Visit   Subjective  Tara Griffin is a 79 y.o. female who presents for the following: Follow-up (Patient here today for 3 month onychomycosis and onycholysis follow up. She is treating with pulse dose terbinafine and she has completed 2 months of treatment so far, advises her nails have improved. No problems with terbinafine. ).  Patient also concerned about some hair loss over the last couple of years.  The following portions of the chart were reviewed this encounter and updated as appropriate:   Tobacco  Allergies  Meds  Problems  Med Hx  Surg Hx  Fam Hx      Review of Systems:  No other skin or systemic complaints except as noted in HPI or Assessment and Plan.  Objective  Well appearing patient in no apparent distress; mood and affect are within normal limits.  A focused examination was performed including fingernails, toenails, scalp. Relevant physical exam findings are noted in the Assessment and Plan.  Objective  toenails: Improving at proximal nail plate  fingernails: Improving at proximal nail plate  Objective  Scalp: Diffuse thinning of the crown and widening of the midline part with retention of the frontal hairline - Reviewed progressive nature and prognosis.   Negative hair pull  Images       Assessment & Plan  Onychomycosis (2) fingernails; toenails  Continue terbinafine pulse dosing in April and June   Terbinafine Counseling  Terbinafine is an anti-fungal medicine that can be applied to the skin (over the counter) or taken by mouth (prescription) to treat fungal infections. The pill version is often used to treat fungal infections of the nails or scalp. While most people do not have any side effects from taking terbinafine pills, some possible side effects of the medicine can include taste changes, headache, loss of smell, vision changes, nausea, vomiting, or diarrhea.   Rare side effects can include irritation of the liver,  allergic reaction, or decrease in blood counts (which may show up as not feeling well or developing an infection). If you are concerned about any of these side effects, please stop the medicine and call your doctor, or in the case of an emergency such as feeling very unwell, seek immediate medical care.    Other Related Medications terbinafine (LAMISIL) 250 MG tablet  Androgenetic alopecia Scalp  Chronic condition, flared. There's no cure, progressive condition.   Patient's thyroid lab reviewed, normal.   Recommend minoxidil 5% (Rogaine for men) solution or foam to be applied to the scalp and left in. This should ideally be used twice daily for best results but it helps with hair regrowth when used at least three times per week. Rogaine initially can cause increased hair shedding for the first few weeks but this will stop with continued use. In studies, people who used minoxidil (Rogaine) for at least 6 months had thicker hair than people who did not. Minoxidil topical (Rogaine) only works as long as it continues to be used. If if it is no longer used then the hair it has been helping to regrow can fall out. Minoxidil topical (Rogaine) cause increased facial hair growth.  Discussed treating orally with prescription finasteride or minoxidil. Deferred today.  Melanocytic Nevi - Tan-brown and/or pink-flesh-colored symmetric macules and papules - Benign appearing on exam today - Observation - Call clinic for new or changing moles - Recommend daily use of broad spectrum spf 30+ sunscreen to sun-exposed areas.   Return in about 6 months (around 06/04/2021) for alopecia and  onychomycosis follow up, TBSE.  Graciella Belton, RMA, am acting as scribe for Forest Gleason, MD .  Documentation: I have reviewed the above documentation for accuracy and completeness, and I agree with the above.  Forest Gleason, MD

## 2020-12-06 ENCOUNTER — Encounter: Payer: Self-pay | Admitting: Dermatology

## 2021-04-05 IMAGING — MG DIGITAL SCREENING BILAT W/ TOMO W/ CAD
8 series · 8 of 24 positions shown · non-contrast
Comparison: Previous exam(s).

CLINICAL DATA: Screening.

EXAM:
DIGITAL SCREENING BILATERAL MAMMOGRAM WITH TOMO AND CAD

[L CC synth-2D]
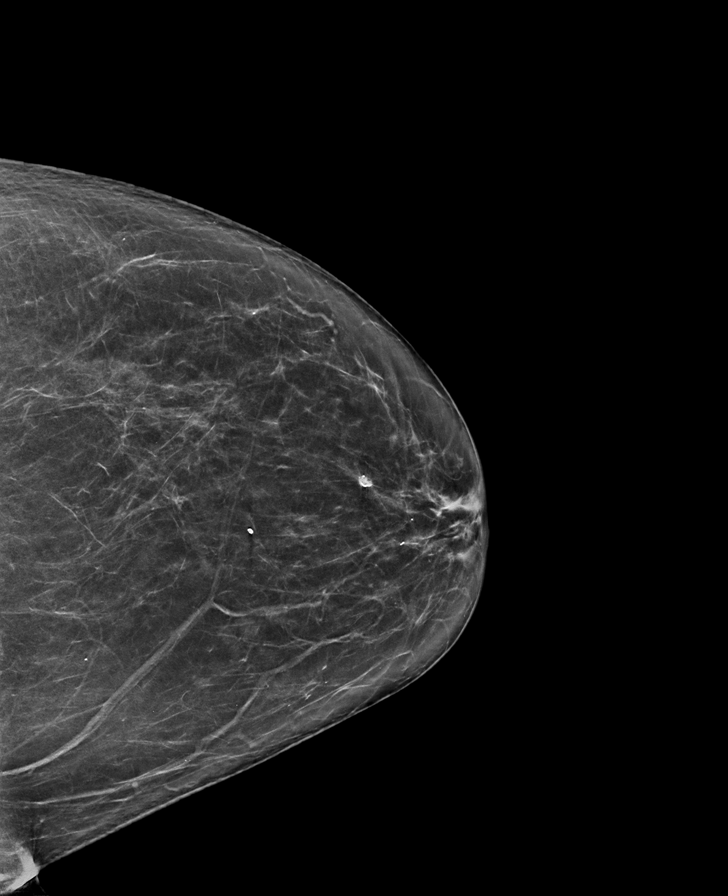

[R MLO synth-2D]
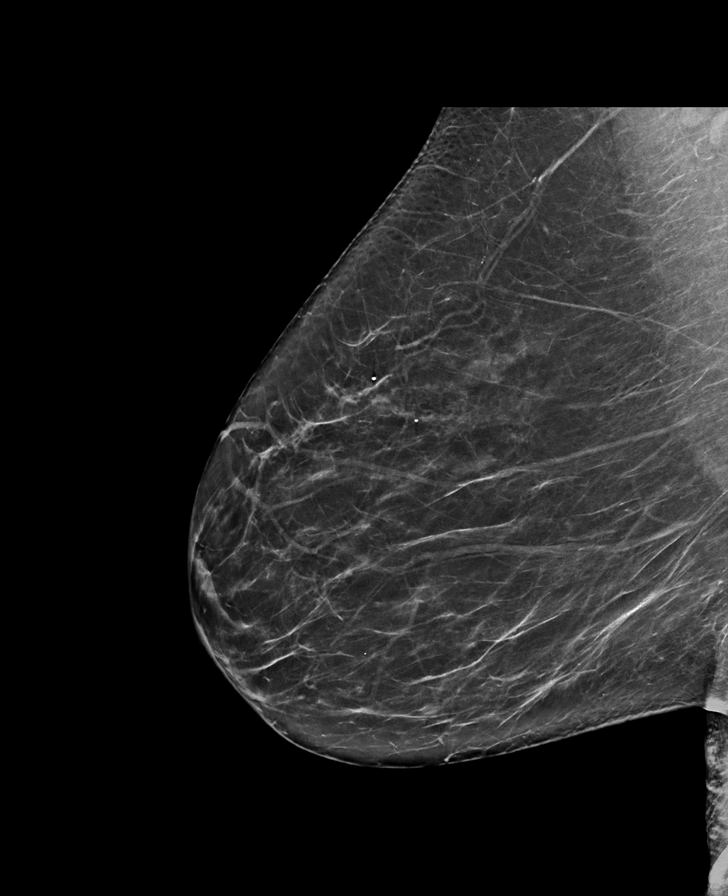

[R CC synth-2D]
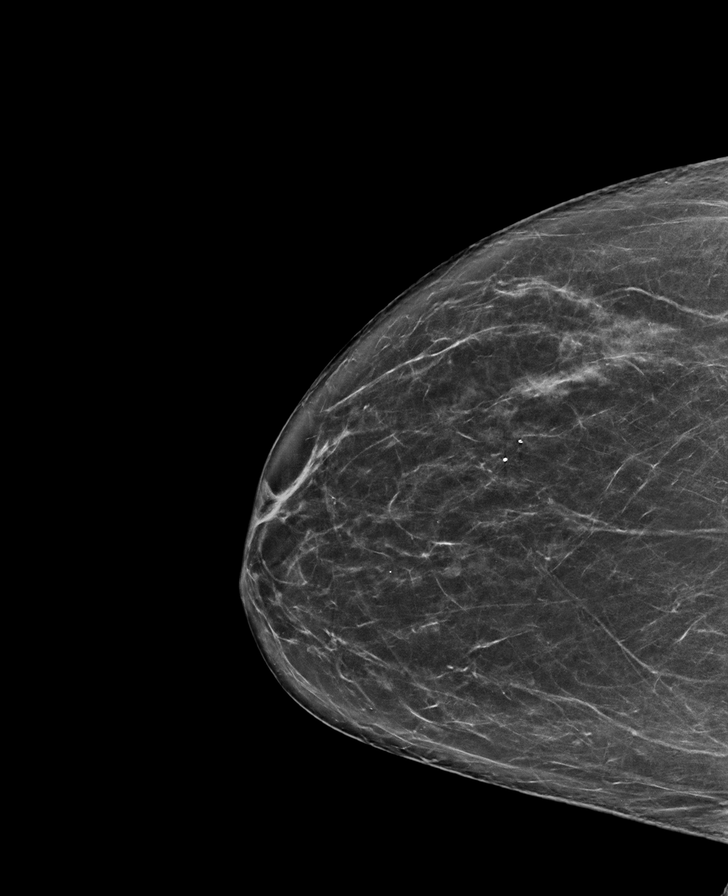

[L MLO synth-2D]
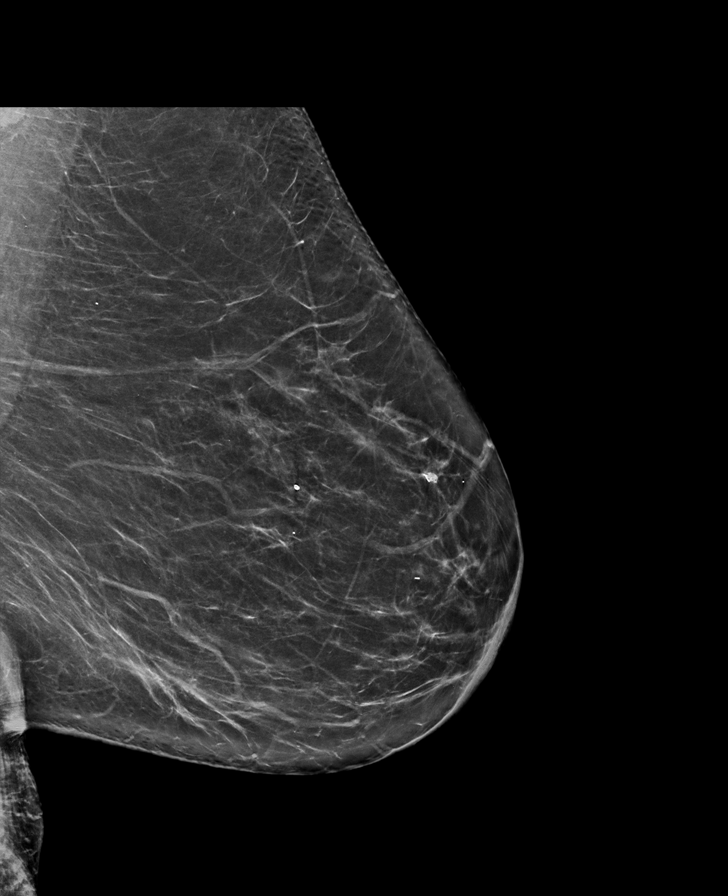

[R CC tomo · tomo slice 37/72.0]
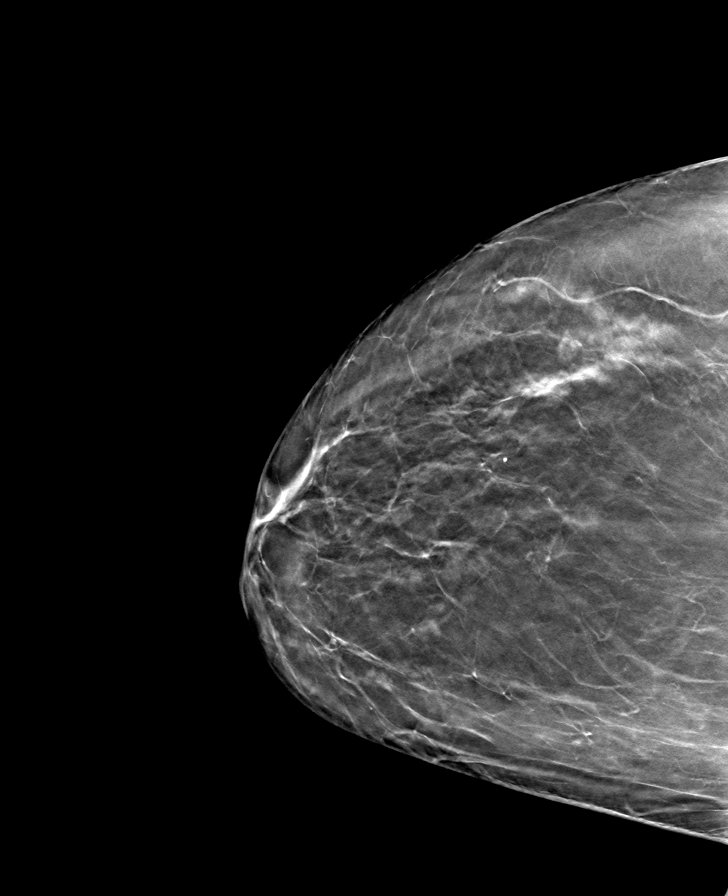

[L CC tomo · tomo slice 37/72.0]
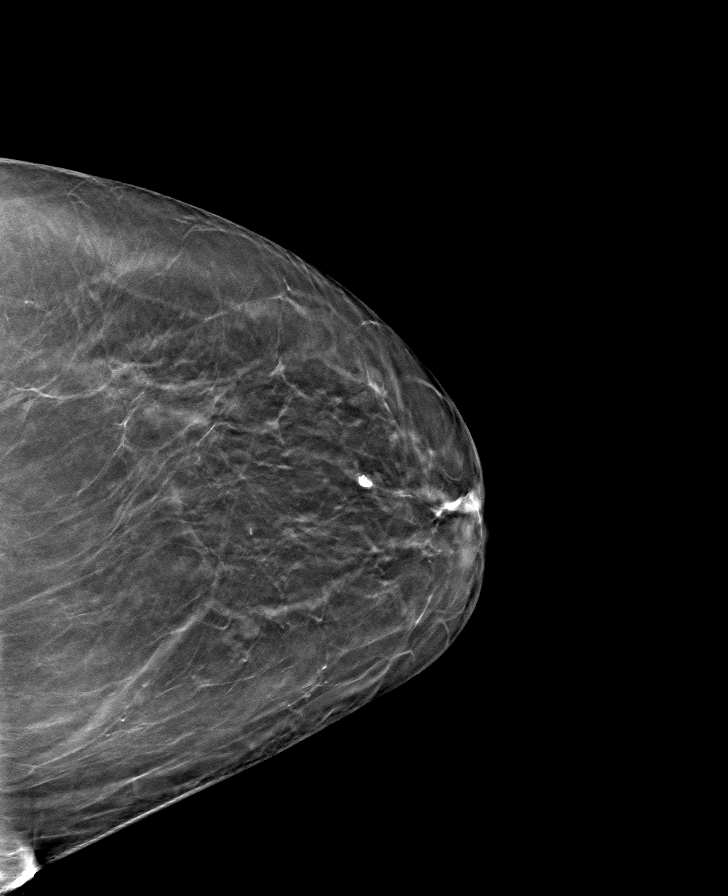

[L MLO tomo · tomo slice 38/75.0]
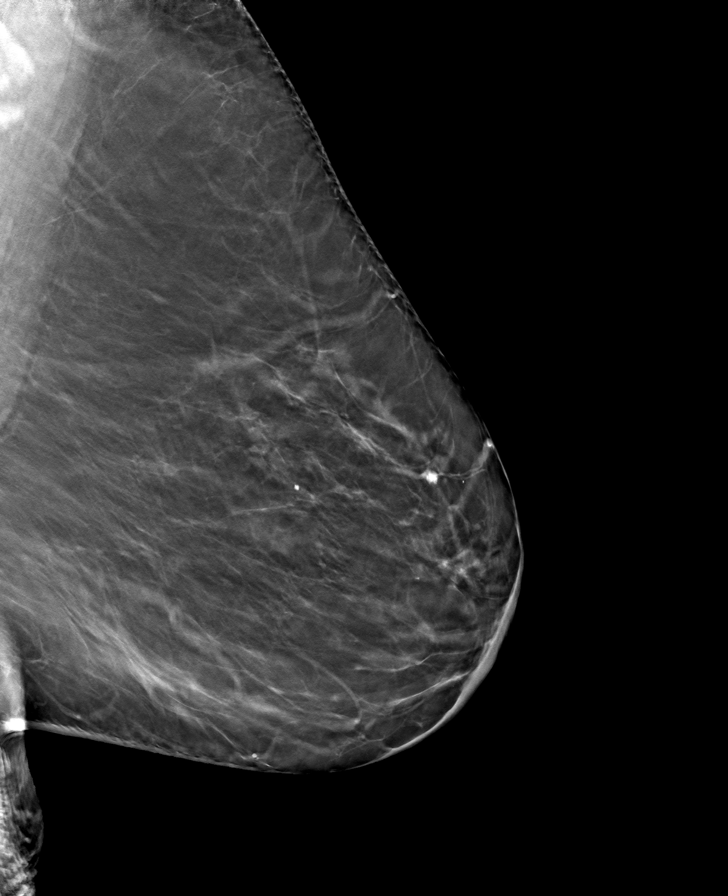

[R MLO tomo · tomo slice 38/75.0]
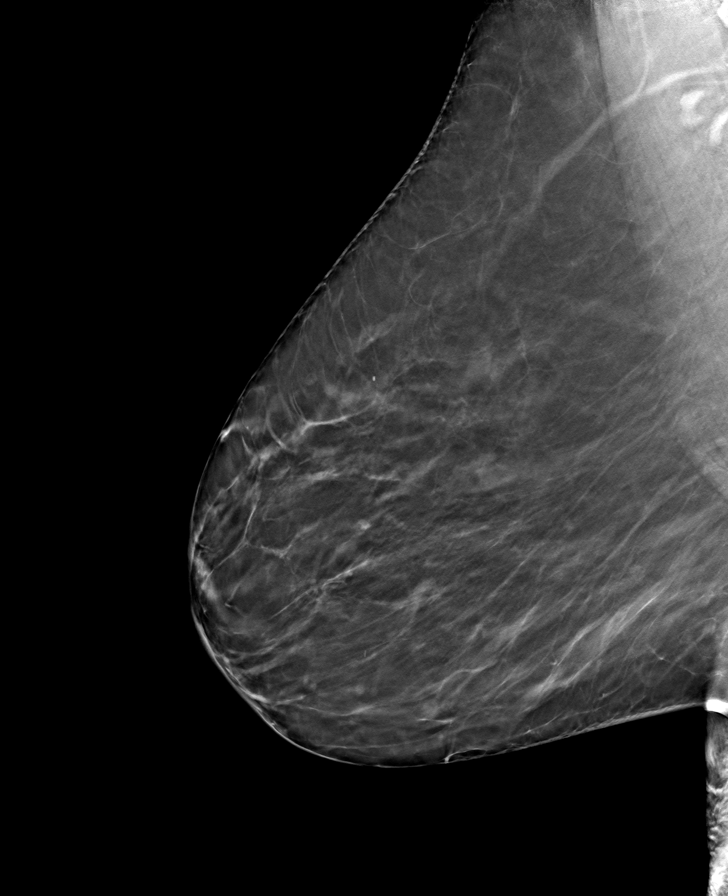

[8 of 24 positions shown; findings below may reference images not displayed]

ACR Breast Density Category b: There are scattered areas of
fibroglandular density.
FINDINGS: There are no findings suspicious for malignancy. Images were
processed with CAD.
IMPRESSION: No mammographic evidence of malignancy. A result letter of this
screening mammogram will be mailed directly to the patient.

RECOMMENDATION:
Screening mammogram in one year. (Code:[id number])

BI-RADS CATEGORY  1: Negative.

## 2021-06-16 ENCOUNTER — Other Ambulatory Visit: Payer: Self-pay

## 2021-06-16 ENCOUNTER — Ambulatory Visit: Payer: Medicare Other | Admitting: Dermatology

## 2021-06-16 DIAGNOSIS — Z808 Family history of malignant neoplasm of other organs or systems: Secondary | ICD-10-CM

## 2021-06-16 DIAGNOSIS — L821 Other seborrheic keratosis: Secondary | ICD-10-CM

## 2021-06-16 DIAGNOSIS — L578 Other skin changes due to chronic exposure to nonionizing radiation: Secondary | ICD-10-CM

## 2021-06-16 DIAGNOSIS — D1800 Hemangioma unspecified site: Secondary | ICD-10-CM

## 2021-06-16 DIAGNOSIS — D239 Other benign neoplasm of skin, unspecified: Secondary | ICD-10-CM

## 2021-06-16 DIAGNOSIS — L814 Other melanin hyperpigmentation: Secondary | ICD-10-CM

## 2021-06-16 DIAGNOSIS — D2372 Other benign neoplasm of skin of left lower limb, including hip: Secondary | ICD-10-CM | POA: Diagnosis not present

## 2021-06-16 DIAGNOSIS — L304 Erythema intertrigo: Secondary | ICD-10-CM

## 2021-06-16 DIAGNOSIS — Z1283 Encounter for screening for malignant neoplasm of skin: Secondary | ICD-10-CM

## 2021-06-16 DIAGNOSIS — D229 Melanocytic nevi, unspecified: Secondary | ICD-10-CM

## 2021-06-16 NOTE — Patient Instructions (Signed)
Melanoma ABCDEs  Melanoma is the most dangerous type of skin cancer, and is the leading cause of death from skin disease.  You are more likely to develop melanoma if you: Have light-colored skin, light-colored eyes, or red or blond hair Spend a lot of time in the sun Tan regularly, either outdoors or in a tanning bed Have had blistering sunburns, especially during childhood Have a close family member who has had a melanoma Have atypical moles or large birthmarks  Early detection of melanoma is key since treatment is typically straightforward and cure rates are extremely high if we catch it early.   The first sign of melanoma is often a change in a mole or a new dark spot.  The ABCDE system is a way of remembering the signs of melanoma.  A for asymmetry:  The two halves do not match. B for border:  The edges of the growth are irregular. C for color:  A mixture of colors are present instead of an even brown color. D for diameter:  Melanomas are usually (but not always) greater than 54mm - the size of a pencil eraser. E for evolution:  The spot keeps changing in size, shape, and color.  Please check your skin once per month between visits. You can use a small mirror in front and a large mirror behind you to keep an eye on the back side or your body.   If you see any new or changing lesions before your next follow-up, please call to schedule a visit.  Please continue daily skin protection including broad spectrum sunscreen SPF 30+ to sun-exposed areas, reapplying every 2 hours as needed when you're outdoors.    Recommend taking Heliocare sun protection supplement daily in sunny weather for additional sun protection. For maximum protection on the sunniest days, you can take up to 2 capsules of regular Heliocare OR take 1 capsule of Heliocare Ultra. For prolonged exposure (such as a full day in the sun), you can repeat your dose of the supplement 4 hours after your first dose. Heliocare can be  purchased at [facility] or at [email or web address].    If you have any questions or concerns for your doctor, please call our main line at [phone] and press option 4 to reach your doctor's medical assistant. If no one answers, please leave a voicemail as directed and we will return your call as soon as possible. Messages left after 4 pm will be answered the following business day.   You may also send [place] a message via [place]. We typically respond to MyChart messages within 1-2 business days.  For prescription refills, please ask your pharmacy to contact our office. Our fax number is [phone].  If you have an urgent issue when the clinic is closed that cannot wait until the next business day, you can page your doctor at the number below.    Please note that while we do our best to be available for urgent issues outside of office hours, we are not available 24/7.   If you have an urgent issue and are unable to reach [place], you may choose to seek medical care at your doctor's office, retail clinic, urgent care center, or emergency room.  If you have a medical emergency, please immediately call 911 or go to the emergency department.  Pager Numbers  - Dr. Nehemiah Massed: [phone]  - Dr. Laurence Ferrari: [phone]  - Dr. Nicole Kindred: [phone]  In the event of inclement weather, please call our main line  at [phone] for an update on the status of any delays or closures.  Dermatology Medication Tips: Please keep the boxes that topical medications come in in order to help keep track of the instructions about where and how to use these. Pharmacies typically print the medication instructions only on the boxes and not directly on the medication tubes.   If your medication is too expensive, please contact our office at [phone] option 4 or send [place] a message through [place].   We are unable to tell what your co-pay for medications will be in advance as this is different depending on your  insurance coverage. However, we may be able to find a substitute medication at lower cost or fill out paperwork to get insurance to cover a needed medication.   If a prior authorization is required to get your medication covered by your insurance company, please allow [place] 1-2 business days to complete this process.  Drug prices often vary depending on where the prescription is filled and some pharmacies may offer cheaper prices.  The website www.goodrx.com contains coupons for medications through different pharmacies. The prices here do not account for what the cost may be with help from insurance (it may be cheaper with your insurance), but the website can give you the price if you did not use any insurance.  - You can print the associated coupon and take it with your prescription to the pharmacy.  - You may also stop by our office during regular business hours and pick up a GoodRx coupon card.  - If you need your prescription sent electronically to a different pharmacy, notify our office through [facility] or by phone at [phone] option 4.

## 2021-06-16 NOTE — Progress Notes (Signed)
   Follow-Up Visit   Subjective  Tara Griffin is a 80 y.o. female who presents for the following: FBSE (Patient here for full body skin exam and skin cancer screening. Patient with no hx of skin cancer but patient's son did have a melanoma. Patient not aware of any new or changing spots. ).   The following portions of the chart were reviewed this encounter and updated as appropriate:   Tobacco  Allergies  Meds  Problems  Med Hx  Surg Hx  Fam Hx      Review of Systems:  No other skin or systemic complaints except as noted in HPI or Assessment and Plan.  Objective  Well appearing patient in no apparent distress; mood and affect are within normal limits.  A full examination was performed including scalp, head, eyes, ears, nose, lips, neck, chest, axillae, abdomen, back, buttocks, bilateral upper extremities, bilateral lower extremities, hands, feet, fingers, toes, fingernails, and toenails. All findings within normal limits unless otherwise noted below.  left calf Firm pink/brown papulenodule with dimple sign.   inframammary Erythematous patches   Assessment & Plan  Dermatofibroma left calf  Benign-appearing.  Observation.  Call clinic for new or changing lesions.   Erythema intertrigo inframammary  Intertrigo is a chronic recurrent rash that occurs in skin fold areas that may be associated with friction; heat; moisture; yeast; fungus; and bacteria.  It is exacerbated by increased movement / activity; sweating; and higher atmospheric temperature.  Not bothersome for patient, continue topical powder as needed.   Lentigines - Scattered tan macules - Due to sun exposure - Benign-appearing, observe - Recommend daily broad spectrum sunscreen SPF 30+ to sun-exposed areas, reapply every 2 hours as needed. - Call for any changes  Seborrheic Keratoses - Stuck-on, waxy, tan-brown papules and/or plaques  - Benign-appearing - Discussed benign etiology and prognosis. -  Observe - Call for any changes  Melanocytic Nevi - Tan-brown and/or pink-flesh-colored symmetric macules and papules - Benign appearing on exam today - Observation - Call clinic for new or changing moles - Recommend daily use of broad spectrum spf 30+ sunscreen to sun-exposed areas.   Hemangiomas - Red papules - Discussed benign nature - Observe - Call for any changes  Actinic Damage - Chronic condition, secondary to cumulative UV/sun exposure - diffuse scaly erythematous macules with underlying dyspigmentation - Recommend daily broad spectrum sunscreen SPF 30+ to sun-exposed areas, reapply every 2 hours as needed.  - Staying in the shade or wearing long sleeves, sun glasses (UVA+UVB protection) and wide brim hats (4-inch brim around the entire circumference of the hat) are also recommended for sun protection.  - Call for new or changing lesions.  Skin cancer screening performed today.  Return in about 1 year (around 06/16/2022) for TBSE.  Graciella Belton, RMA, am acting as scribe for Forest Gleason, MD .  Documentation: I have reviewed the above documentation for accuracy and completeness, and I agree with the above.  Forest Gleason, MD

## 2021-06-21 ENCOUNTER — Encounter: Payer: Self-pay | Admitting: Dermatology

## 2021-11-16 IMAGING — DX DG WRIST 2V*L*
2 series · 2 of 2 positions shown · non-contrast
Comparison: None.

CLINICAL DATA: Status post ORIF left wrist.

EXAM:
LEFT WRIST - 2 VIEW

[wrist ap]
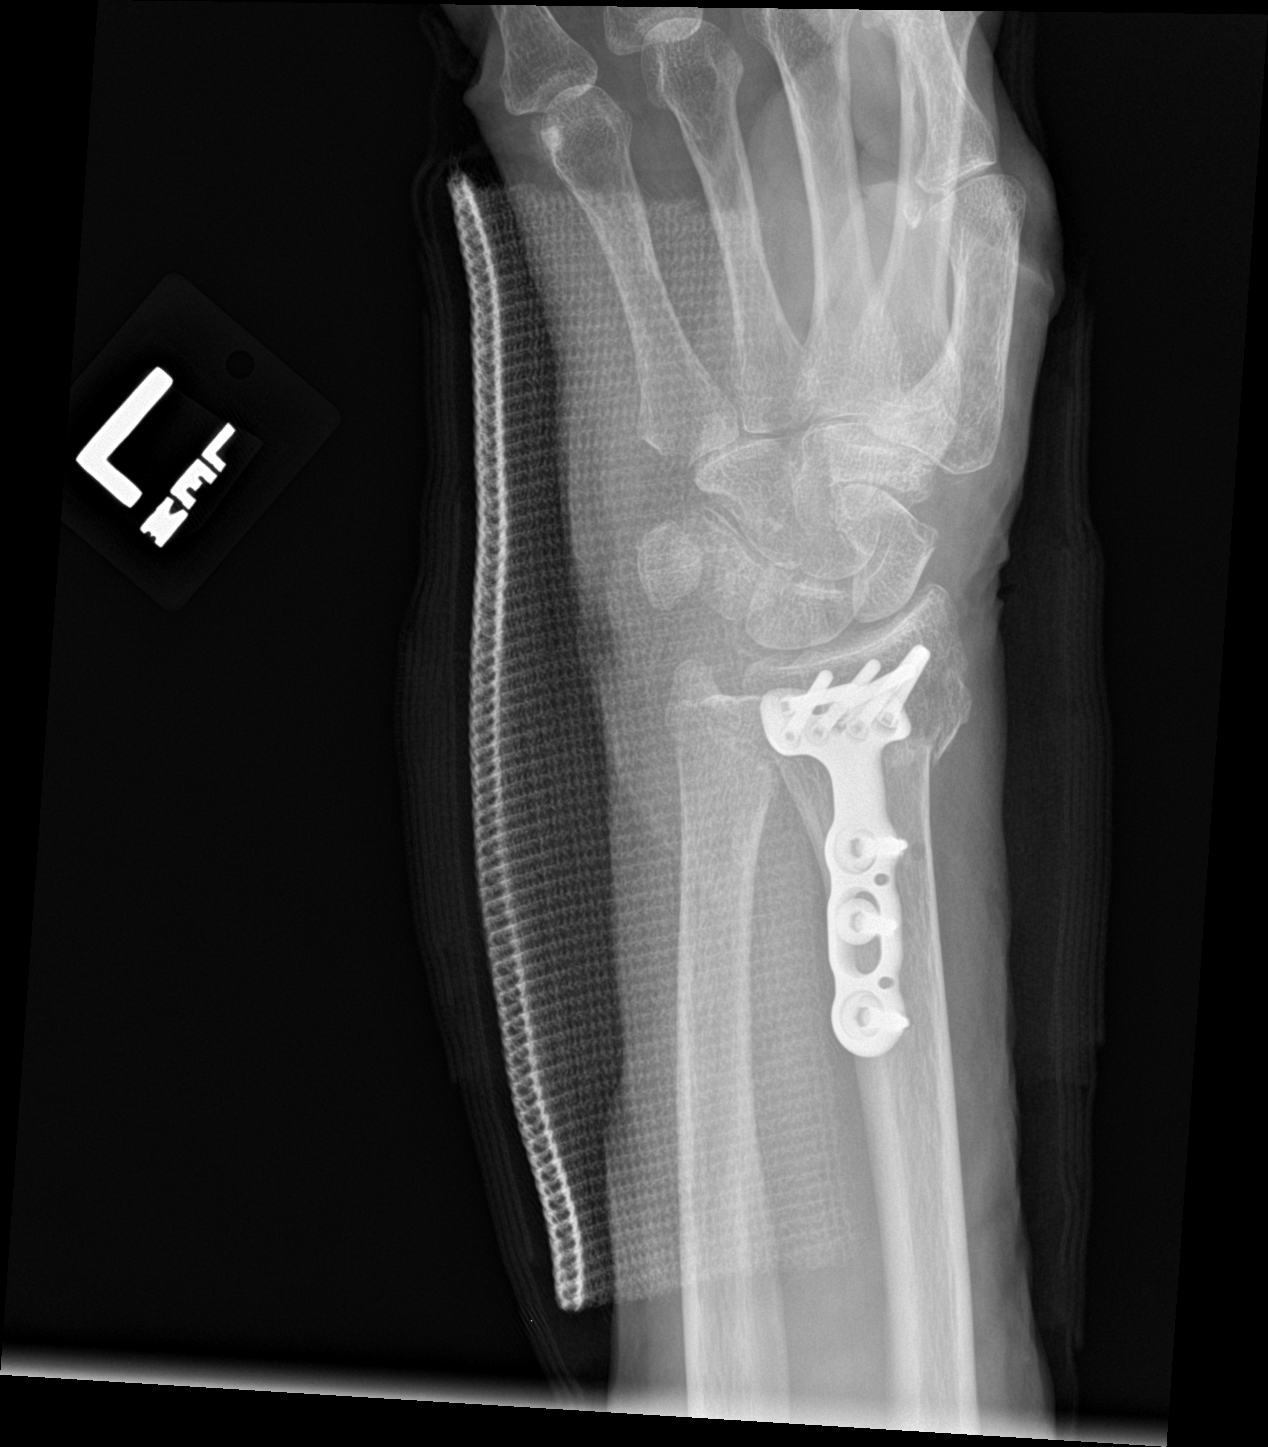

[wrist lat]
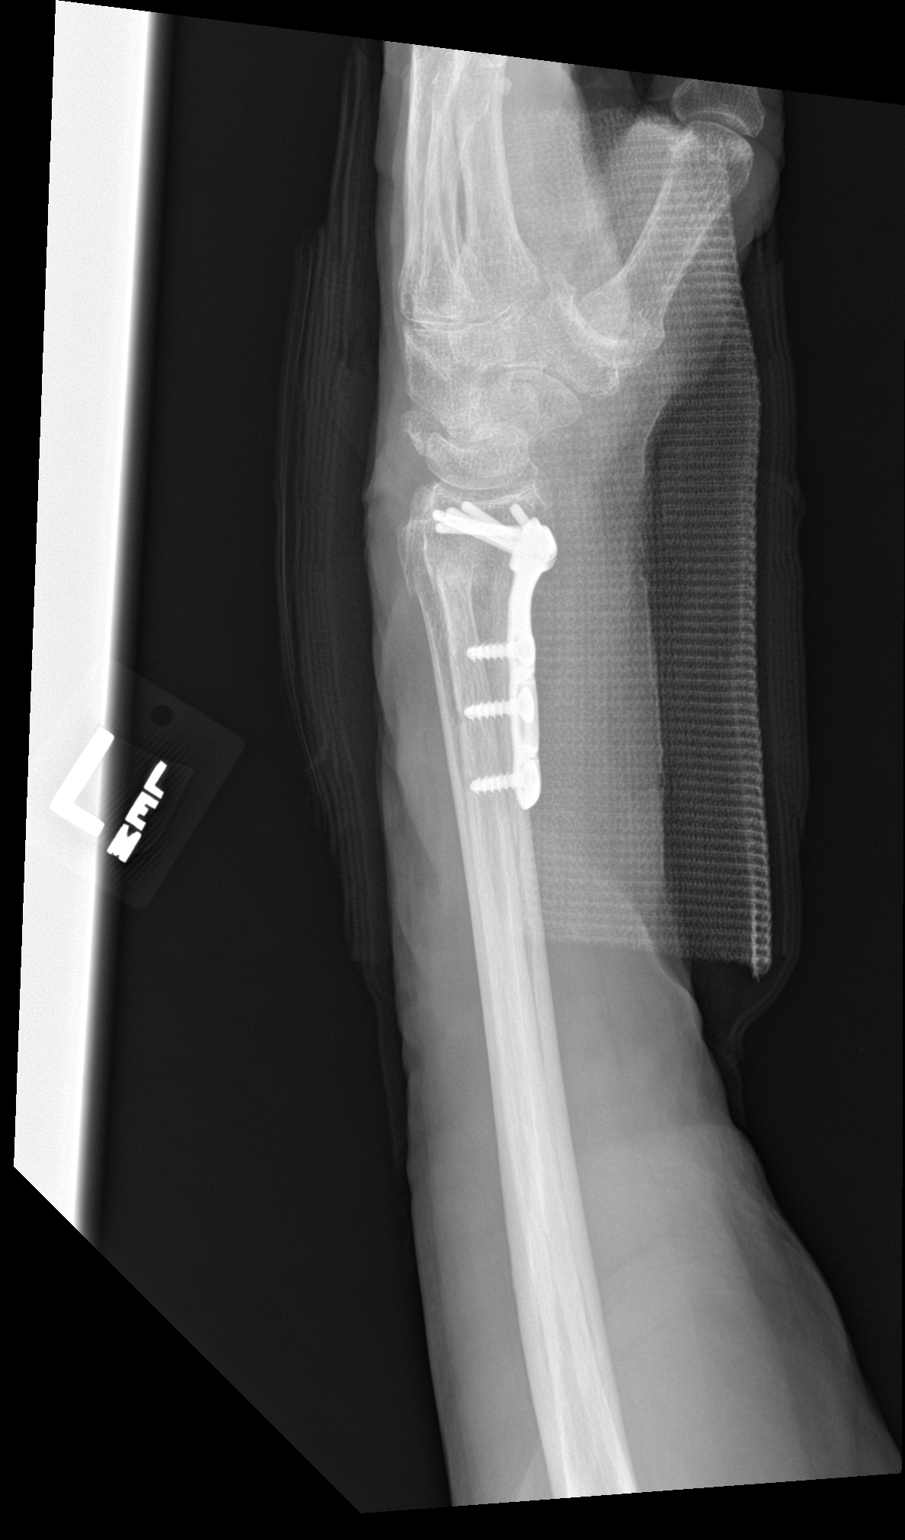

[2 of 2 positions shown; findings below may reference images not displayed]

FINDINGS: Volar plate and multi screw fixation of distal radius fracture. No
periprosthetic lucency. Overlying splint material in place limiting
osseous and soft tissue fine detail. No obvious ulna styloid
fractures seen
IMPRESSION: ORIF of distal radius fracture without immediate postoperative
complication.

## 2022-06-22 ENCOUNTER — Ambulatory Visit: Payer: Medicare Other | Admitting: Dermatology

## 2022-06-22 DIAGNOSIS — L578 Other skin changes due to chronic exposure to nonionizing radiation: Secondary | ICD-10-CM

## 2022-06-22 DIAGNOSIS — L821 Other seborrheic keratosis: Secondary | ICD-10-CM

## 2022-06-22 DIAGNOSIS — L814 Other melanin hyperpigmentation: Secondary | ICD-10-CM | POA: Diagnosis not present

## 2022-06-22 DIAGNOSIS — Z808 Family history of malignant neoplasm of other organs or systems: Secondary | ICD-10-CM

## 2022-06-22 DIAGNOSIS — D229 Melanocytic nevi, unspecified: Secondary | ICD-10-CM

## 2022-06-22 DIAGNOSIS — D2372 Other benign neoplasm of skin of left lower limb, including hip: Secondary | ICD-10-CM

## 2022-06-22 DIAGNOSIS — D1801 Hemangioma of skin and subcutaneous tissue: Secondary | ICD-10-CM

## 2022-06-22 DIAGNOSIS — Z1283 Encounter for screening for malignant neoplasm of skin: Secondary | ICD-10-CM | POA: Diagnosis not present

## 2022-06-22 DIAGNOSIS — L570 Actinic keratosis: Secondary | ICD-10-CM

## 2022-06-22 NOTE — Patient Instructions (Addendum)
Cryotherapy Aftercare  Wash gently with soap and water everyday.   Apply Vaseline and Band-Aid daily until healed.   Recommend taking Heliocare sun protection supplement daily in sunny weather for additional sun protection. For maximum protection on the sunniest days, you can take up to 2 capsules of regular Heliocare OR take 1 capsule of Heliocare Ultra. For prolonged exposure (such as a full day in the sun), you can repeat your dose of the supplement 4 hours after your first dose. Heliocare can be purchased at [facility], at some [facility] or at [email or web address].    Melanoma ABCDEs  Melanoma is the most dangerous type of skin cancer, and is the leading cause of death from skin disease.  You are more likely to develop melanoma if you: Have light-colored skin, light-colored eyes, or red or blond hair Spend a lot of time in the sun Tan regularly, either outdoors or in a tanning bed Have had blistering sunburns, especially during childhood Have a close family member who has had a melanoma Have atypical moles or large birthmarks  Early detection of melanoma is key since treatment is typically straightforward and cure rates are extremely high if we catch it early.   The first sign of melanoma is often a change in a mole or a new dark spot.  The ABCDE system is a way of remembering the signs of melanoma.  A for asymmetry:  The two halves do not match. B for border:  The edges of the growth are irregular. C for color:  A mixture of colors are present instead of an even brown color. D for diameter:  Melanomas are usually (but not always) greater than 6mm - the size of a pencil eraser. E for evolution:  The spot keeps changing in size, shape, and color.  Please check your skin once per month between visits. You can use a small mirror in front and a large mirror behind you to keep an eye on the back side or your body.   If you see any new or changing lesions before your next follow-up,  please call to schedule a visit.  Please continue daily skin protection including broad spectrum sunscreen SPF 30+ to sun-exposed areas, reapplying every 2 hours as needed when you're outdoors.    Due to recent changes in healthcare laws, you may see results of your pathology and/or laboratory studies on MyChart before the doctors have had a chance to review them. We understand that in some cases there may be results that are confusing or concerning to you. Please understand that not all results are received at the same time and often the doctors may need to interpret multiple results in order to provide you with the best plan of care or course of treatment. Therefore, we ask that you please give us 2 business days to thoroughly review all your results before contacting the office for clarification. Should we see a critical lab result, you will be contacted sooner.   If You Need Anything After Your Visit  If you have any questions or concerns for your doctor, please call our main line at [phone] and press option 4 to reach your doctor's medical assistant. If no one answers, please leave a voicemail as directed and we will return your call as soon as possible. Messages left after 4 pm will be answered the following business day.   You may also send us a message via MyChart. We typically respond to MyChart messages within 1-2 business days.    For prescription refills, please ask your pharmacy to contact our office. Our fax number is [phone].  If you have an urgent issue when the clinic is closed that cannot wait until the next business day, you can page your doctor at the number below.    Please note that while we do our best to be available for urgent issues outside of office hours, we are not available 24/7.   If you have an urgent issue and are unable to reach us, you may choose to seek medical care at your doctor's office, retail clinic, urgent care center, or emergency room.  If you  have a medical emergency, please immediately call 911 or go to the emergency department.  Pager Numbers  - Dr. Kowalski: [phone]  - Dr. Moye: [phone]  - Dr. Stewart: [phone]  In the event of inclement weather, please call our main line at [phone] for an update on the status of any delays or closures.  Dermatology Medication Tips: Please keep the boxes that topical medications come in in order to help keep track of the instructions about where and how to use these. Pharmacies typically print the medication instructions only on the boxes and not directly on the medication tubes.   If your medication is too expensive, please contact our office at [phone] option 4 or send us a message through MyChart.   We are unable to tell what your co-pay for medications will be in advance as this is different depending on your insurance coverage. However, we may be able to find a substitute medication at lower cost or fill out paperwork to get insurance to cover a needed medication.   If a prior authorization is required to get your medication covered by your insurance company, please allow us 1-2 business days to complete this process.  Drug prices often vary depending on where the prescription is filled and some pharmacies may offer cheaper prices.  The website www.goodrx.com contains coupons for medications through different pharmacies. The prices here do not account for what the cost may be with help from insurance (it may be cheaper with your insurance), but the website can give you the price if you did not use any insurance.  - You can print the associated coupon and take it with your prescription to the pharmacy.  - You may also stop by our office during regular business hours and pick up a GoodRx coupon card.  - If you need your prescription sent electronically to a different pharmacy, notify our office through [place] MyChart or by phone at [phone] option  4.     Si Usted Necesita Algo Despus de Su Visita  Tambin puede enviarnos un mensaje a travs de MyChart. Por lo general respondemos a los mensajes de MyChart en el transcurso de 1 a 2 das hbiles.  Para renovar recetas, por favor pida a su farmacia que se ponga en contacto con nuestra oficina. Nuestro nmero de fax es el [phone].  Si tiene un asunto urgente cuando la clnica est cerrada y que no puede esperar hasta el siguiente da hbil, puede llamar/localizar a su doctor(a) al nmero que aparece a continuacin.   Por favor, tenga en cuenta que aunque hacemos todo lo posible para estar disponibles para asuntos urgentes fuera del horario de oficina, no estamos disponibles las 24 horas del da, los 7 das de la semana.   Si tiene un problema urgente y no puede comunicarse con nosotros, puede optar por buscar atencin mdica  en   el consultorio de su doctor(a), en una clnica privada, en un centro de atencin urgente o en una sala de emergencias.  Si tiene una emergencia mdica, por favor llame inmediatamente al 911 o vaya a la sala de emergencias.  Nmeros de bper  - Dr. Kowalski: [phone]  - Dra. Moye: [phone]  - Dra. Stewart: [phone]  En caso de inclemencias del tiempo, por favor llame a nuestra lnea principal al [phone] para una actualizacin sobre el estado de cualquier retraso o cierre.  Consejos para la medicacin en dermatologa: Por favor, guarde las cajas en las que vienen los medicamentos de uso tpico para ayudarle a seguir las instrucciones sobre dnde y cmo usarlos. Las farmacias generalmente imprimen las instrucciones del medicamento slo en las cajas y no directamente en los tubos del medicamento.   Si su medicamento es muy caro, por favor, pngase en contacto con nuestra oficina llamando al [phone] y presione la opcin 4 o envenos un mensaje a travs de MyChart.   No podemos decirle cul ser su copago por los medicamentos por  adelantado ya que esto es diferente dependiendo de la cobertura de su seguro. Sin embargo, es posible que podamos encontrar un medicamento sustituto a menor costo o llenar un formulario para que el seguro cubra el medicamento que se considera necesario.   Si se requiere una autorizacin previa para que su compaa de seguros cubra su medicamento, por favor permtanos de 1 a 2 das hbiles para completar este proceso.  Los precios de los medicamentos varan con frecuencia dependiendo del lugar de dnde se surte la receta y alguna farmacias pueden ofrecer precios ms baratos.  El sitio web www.goodrx.com tiene cupones para medicamentos de diferentes farmacias. Los precios aqu no tienen en cuenta lo que podra costar con la ayuda del seguro (puede ser ms barato con su seguro), pero el sitio web puede darle el precio si no utiliz ningn seguro.  - Puede imprimir el cupn correspondiente y llevarlo con su receta a la farmacia.  - Tambin puede pasar por nuestra oficina durante el horario de atencin regular y recoger una tarjeta de cupones de GoodRx.  - Si necesita que su receta se enve electrnicamente a una farmacia diferente, informe a nuestra oficina a travs de MyChart de [place] o por telfono llamando al [phone] y presione la opcin 4.  

## 2022-06-22 NOTE — Progress Notes (Signed)
Follow-Up Visit   Subjective  Tara Griffin is a 81 y.o. female who presents for the following: Annual Exam (Fhx of MM in son, no personal hx of skin cancer).   The patient presents for Total-Body Skin Exam (TBSE) for skin cancer screening and mole check.  The patient has spots, moles and lesions to be evaluated, some may be new or changing.    The following portions of the chart were reviewed this encounter and updated as appropriate:   Tobacco  Allergies  Meds  Problems  Med Hx  Surg Hx  Fam Hx      Review of Systems:  No other skin or systemic complaints except as noted in HPI or Assessment and Plan.  Objective  Well appearing patient in no apparent distress; mood and affect are within normal limits.  A full examination was performed including scalp, head, eyes, ears, nose, lips, neck, chest, axillae, abdomen, back, buttocks, bilateral upper extremities, bilateral lower extremities, hands, feet, fingers, toes, fingernails, and toenails. All findings within normal limits unless otherwise noted below.  Right Cheek x 2, left mid forehead x 1, left lat forehead x 1, mid forehead x 1, left cheek x 2, left eyebrow x 1 (8) Erythematous thin papules/macules with gritty scale.     Assessment & Plan  AK (actinic keratosis) (8) Right Cheek x 2, left mid forehead x 1, left lat forehead x 1, mid forehead x 1, left cheek x 2, left eyebrow x 1  Actinic keratoses are precancerous spots that appear secondary to cumulative UV radiation exposure/sun exposure over time. They are chronic with expected duration over 1 year. A portion of actinic keratoses will progress to squamous cell carcinoma of the skin. It is not possible to reliably predict which spots will progress to skin cancer and so treatment is recommended to prevent development of skin cancer.  Recommend daily broad spectrum sunscreen SPF 30+ to sun-exposed areas, reapply every 2 hours as needed.  Recommend staying in the shade or  wearing long sleeves, sun glasses (UVA+UVB protection) and wide brim hats (4-inch brim around the entire circumference of the hat). Call for new or changing lesions.  Prior to procedure, discussed risks of blister formation, small wound, skin dyspigmentation, or rare scar following cryotherapy. Recommend Vaseline ointment to treated areas while healing.  Destruction of lesion - Right Cheek x 2, left mid forehead x 1, left lat forehead x 1, mid forehead x 1, left cheek x 2, left eyebrow x 1  Destruction method: cryotherapy   Informed consent: discussed and consent obtained   Lesion destroyed using liquid nitrogen: Yes   Cryotherapy cycles:  2 Outcome: patient tolerated procedure well with no complications   Post-procedure details: wound care instructions given     Lentigines - Scattered tan macules - Due to sun exposure - Benign-appearing, observe - Recommend daily broad spectrum sunscreen SPF 30+ to sun-exposed areas, reapply every 2 hours as needed. - Call for any changes  Seborrheic Keratoses - Stuck-on, waxy, tan-brown papules and/or plaques  - Benign-appearing - Discussed benign etiology and prognosis. - Observe - Call for any changes  Melanocytic Nevi - Tan-brown and/or pink-flesh-colored symmetric macules and papules - Benign appearing on exam today - Observation - Call clinic for new or changing moles - Recommend daily use of broad spectrum spf 30+ sunscreen to sun-exposed areas.   Hemangiomas - Red papules - Discussed benign nature - Observe - Call for any changes  Actinic Damage - Chronic condition, secondary to  cumulative UV/sun exposure - diffuse scaly erythematous macules with underlying dyspigmentation - Recommend daily broad spectrum sunscreen SPF 30+ to sun-exposed areas, reapply every 2 hours as needed.  - Staying in the shade or wearing long sleeves, sun glasses (UVA+UVB protection) and wide brim hats (4-inch brim around the entire circumference of the  hat) are also recommended for sun protection.  - Call for new or changing lesions.  Family history of skin cancer - what type(s): melanoma  - who affected: son  Skin cancer screening performed today.  Dermatofibroma - Firm pink/brown papulenodule with dimple sign at left calf - Benign appearing - Call for any changes  Return in about 1 year (around 06/23/2023) for TBSE.  Luther Redo, CMA, am acting as scribe for Forest Gleason, MD .  Documentation: I have reviewed the above documentation for accuracy and completeness, and I agree with the above.  Forest Gleason, MD

## 2022-06-23 ENCOUNTER — Encounter: Payer: Self-pay | Admitting: Dermatology

## 2022-10-03 DIAGNOSIS — K219 Gastro-esophageal reflux disease without esophagitis: Secondary | ICD-10-CM | POA: Insufficient documentation

## 2022-10-03 DIAGNOSIS — E785 Hyperlipidemia, unspecified: Secondary | ICD-10-CM | POA: Insufficient documentation

## 2023-06-27 ENCOUNTER — Encounter: Payer: Medicare Other | Admitting: Dermatology

## 2023-07-05 ENCOUNTER — Encounter: Payer: Self-pay | Admitting: Dermatology

## 2023-07-05 ENCOUNTER — Ambulatory Visit: Payer: Medicare Other | Admitting: Dermatology

## 2023-07-05 VITALS — BP 129/75

## 2023-07-05 DIAGNOSIS — L814 Other melanin hyperpigmentation: Secondary | ICD-10-CM | POA: Diagnosis not present

## 2023-07-05 DIAGNOSIS — L578 Other skin changes due to chronic exposure to nonionizing radiation: Secondary | ICD-10-CM | POA: Diagnosis not present

## 2023-07-05 DIAGNOSIS — W908XXA Exposure to other nonionizing radiation, initial encounter: Secondary | ICD-10-CM

## 2023-07-05 DIAGNOSIS — Z808 Family history of malignant neoplasm of other organs or systems: Secondary | ICD-10-CM

## 2023-07-05 DIAGNOSIS — Z1283 Encounter for screening for malignant neoplasm of skin: Secondary | ICD-10-CM

## 2023-07-05 DIAGNOSIS — D229 Melanocytic nevi, unspecified: Secondary | ICD-10-CM

## 2023-07-05 DIAGNOSIS — Z872 Personal history of diseases of the skin and subcutaneous tissue: Secondary | ICD-10-CM

## 2023-07-05 DIAGNOSIS — L304 Erythema intertrigo: Secondary | ICD-10-CM

## 2023-07-05 DIAGNOSIS — D1801 Hemangioma of skin and subcutaneous tissue: Secondary | ICD-10-CM

## 2023-07-05 DIAGNOSIS — M1990 Unspecified osteoarthritis, unspecified site: Secondary | ICD-10-CM | POA: Insufficient documentation

## 2023-07-05 DIAGNOSIS — L821 Other seborrheic keratosis: Secondary | ICD-10-CM

## 2023-07-05 NOTE — Patient Instructions (Signed)

## 2023-07-05 NOTE — Progress Notes (Signed)
   Follow-Up Visit   Subjective  Tara Griffin is a 82 y.o. female who presents for the following: Skin Cancer Screening and Full Body Skin Exam, hx of Aks, Fhx of Melanoma, son  The patient presents for Total-Body Skin Exam (TBSE) for skin cancer screening and mole check. The patient has spots, moles and lesions to be evaluated, some may be new or changing and the patient may have concern these could be cancer.    The following portions of the chart were reviewed this encounter and updated as appropriate: medications, allergies, medical history  Review of Systems:  No other skin or systemic complaints except as noted in HPI or Assessment and Plan.  Objective  Well appearing patient in no apparent distress; mood and affect are within normal limits.  A full examination was performed including scalp, head, eyes, ears, nose, lips, neck, chest, axillae, abdomen, back, buttocks, bilateral upper extremities, bilateral lower extremities, hands, feet, fingers, toes, fingernails, and toenails. All findings within normal limits unless otherwise noted below.   Relevant physical exam findings are noted in the Assessment and Plan.    Assessment & Plan   SKIN CANCER SCREENING PERFORMED TODAY.  ACTINIC DAMAGE - Chronic condition, secondary to cumulative UV/sun exposure - diffuse scaly erythematous macules with underlying dyspigmentation - Recommend daily broad spectrum sunscreen SPF 30+ to sun-exposed areas, reapply every 2 hours as needed.  - Staying in the shade or wearing long sleeves, sun glasses (UVA+UVB protection) and wide brim hats (4-inch brim around the entire circumference of the hat) are also recommended for sun protection.  - Call for new or changing lesions.  LENTIGINES, SEBORRHEIC KERATOSES, HEMANGIOMAS - Benign normal skin lesions - Benign-appearing - Call for any changes  MELANOCYTIC NEVI - Tan-brown and/or pink-flesh-colored symmetric macules and papules - Benign appearing  on exam today - Observation - Call clinic for new or changing moles - Recommend daily use of broad spectrum spf 30+ sunscreen to sun-exposed areas.   FAMILY HISTORY OF SKIN CANCER What type(s):Melanoma Who affected:son   INTERTRIGO L inframammary Exam: pink macerated patch L inframammary  Chronic and persistent condition with duration or expected duration over one year. Condition is bothersome/symptomatic for patient. Currently flared.   Intertrigo is a chronic recurrent rash that occurs in skin fold areas that may be associated with friction; heat; moisture; yeast; fungus; and bacteria.  It is exacerbated by increased movement / activity; sweating; and higher atmospheric temperature.  Treatment Plan Patient declines treatment at this time.  HISTORY OF PRECANCEROUS ACTINIC KERATOSIS - site(s) of PreCancerous Actinic Keratosis clear today. - these may recur and new lesions may form requiring treatment to prevent transformation into skin cancer - observe for new or changing spots and contact [facility] for appointment if occur - photoprotection with sun protective clothing; sunglasses and broad spectrum sunscreen with SPF of at least 30 + and frequent self skin exams recommended - yearly exams by a dermatologist recommended for persons with history of PreCancerous Actinic Keratoses  Multiple benign nevi  Lentigines  Actinic elastosis  Seborrheic keratoses  Cherry angioma  Erythema intertrigo   Return in about 1 year (around 07/04/2024) for TBSE, Hx of AKs.  I, Ardis Rowan, RMA, am acting as scribe for Elie Goody, MD .   Documentation: I have reviewed the above documentation for accuracy and completeness, and I agree with the above.  Elie Goody, MD

## 2023-09-06 ENCOUNTER — Encounter: Payer: Self-pay | Admitting: Emergency Medicine

## 2023-09-06 ENCOUNTER — Emergency Department: Payer: Medicare Other

## 2023-09-06 ENCOUNTER — Other Ambulatory Visit: Payer: Self-pay

## 2023-09-06 ENCOUNTER — Emergency Department
Admission: EM | Admit: 2023-09-06 | Discharge: 2023-09-06 | Disposition: A | Discharge status: Home / Self Care | Payer: Medicare Other | Attending: Emergency Medicine | Admitting: Emergency Medicine

## 2023-09-06 DIAGNOSIS — S62616A Displaced fracture of proximal phalanx of right little finger, initial encounter for closed fracture: Secondary | ICD-10-CM | POA: Diagnosis not present

## 2023-09-06 DIAGNOSIS — W010XXA Fall on same level from slipping, tripping and stumbling without subsequent striking against object, initial encounter: Secondary | ICD-10-CM | POA: Insufficient documentation

## 2023-09-06 DIAGNOSIS — S0083XA Contusion of other part of head, initial encounter: Secondary | ICD-10-CM | POA: Insufficient documentation

## 2023-09-06 DIAGNOSIS — Y92510 Bank as the place of occurrence of the external cause: Secondary | ICD-10-CM | POA: Diagnosis not present

## 2023-09-06 DIAGNOSIS — S62657A Nondisplaced fracture of medial phalanx of left little finger, initial encounter for closed fracture: Secondary | ICD-10-CM | POA: Insufficient documentation

## 2023-09-06 DIAGNOSIS — S6991XA Unspecified injury of right wrist, hand and finger(s), initial encounter: Secondary | ICD-10-CM | POA: Diagnosis present

## 2023-09-06 MED ORDER — TRAMADOL HCL 50 MG PO TABS
50.0000 mg | ORAL_TABLET | Freq: Once | ORAL | Status: AC
  Administered 2023-09-06: 50 mg via ORAL
  Filled 2023-09-06: qty 1

## 2023-09-06 MED ORDER — TRAMADOL HCL 50 MG PO TABS: 50.0000 mg | ORAL_TABLET | Freq: Four times a day (QID) | ORAL | 0 refills | Status: AC | PRN

## 2023-09-06 NOTE — ED Provider Triage Note (Signed)
Emergency Medicine Provider Triage Evaluation Note  Tara Griffin , a 82 y.o. female  was evaluated in triage.  Pt complains of fall just PTA. Tripped on curb and fit bilateral hands and also her face. No LOC. Was able to walk post fall. No open wounds.   Review of Systems  Positive: Hematoma to right side of face, hand pain Negative: Loc, vomiting  Physical Exam  BP (!) 147/78 (BP Location: Right Arm)   Pulse 77   Temp 98.2 F (36.8 C) (Oral)   Resp 17   Ht 5\' 3"  (1.6 m)   Wt 62.6 kg   SpO2 97%   BMI 24.45 kg/m  Gen:   Awake, no distress   Resp:  Normal effort  MSK:   Moves extremities without difficulty  Other:  Hematoma to right side of face, bruising to bilateral 5th fingers  Medical Decision Making  Medically screening exam initiated at 12:57 PM.  Appropriate orders placed.  Andera Ragain was informed that the remainder of the evaluation will be completed by another provider, this initial triage assessment does not replace that evaluation, and the importance of remaining in the ED until their evaluation is complete.     Jackelyn Hoehn, PA-C 09/06/23 1259

## 2023-09-06 NOTE — ED Notes (Signed)
Splint place to left 5th finger and splint placed to right hand

## 2023-09-06 NOTE — ED Provider Notes (Signed)
[facility] Provider Note    Event Date/Time   First MD Initiated Contact with Patient 09/06/23 1911     (approximate)   History   Fall   HPI  Tara Griffin is a 82 y.o. female with a history of GERD, osteopenia who comes to the ED after mechanical fall, complains of pain at the right face as well as in bilateral ulnar hands.  Reports being in her usual state of health, was going to the bank, and after getting out of her car tripped over the curb falling onto concrete.  Tried to stop her self with her hands.  No loss of consciousness.  No blood thinner use.     Physical Exam   Triage Vital Signs: ED Triage Vitals  Encounter Vitals Group     BP 09/06/23 1240 (!) 147/78     Systolic BP Percentile --      Diastolic BP Percentile --      Pulse Rate 09/06/23 1240 77     Resp 09/06/23 1240 17     Temp 09/06/23 1240 98.2 F (36.8 C)     Temp Source 09/06/23 1240 Oral     SpO2 09/06/23 1240 97 %     Weight 09/06/23 1241 138 lb (62.6 kg)     Height 09/06/23 1241 5\' 3"  (1.6 m)     Head Circumference --      Peak Flow --      Pain Score 09/06/23 1246 6     Pain Loc --      Pain Education --      Exclude from Growth Chart --     Most recent vital signs: Vitals:   09/06/23 1240  BP: (!) 147/78  Pulse: 77  Resp: 17  Temp: 98.2 F (36.8 C)  SpO2: 97%    General: Awake, no distress.  CV:  Good peripheral perfusion.  Normal radial pulses Resp:  Normal effort.  Abd:  No distention.  Other:  Bruising over the right face.  No facial depression, no open wounds.  Full range of motion in the cervical spine without midline spinal tenderness.  There is bruising over the right ulnar half of the hand and proximal fifth finger.  Small amount of bruising over the middle phalanx of the left fifth finger.   ED Results / Procedures / Treatments   Labs (all labs ordered are listed, but only abnormal results are displayed) Labs Reviewed - No data to  display   RADIOLOGY X-ray right hand interpreted by me, shows fracture of the distal fifth metacarpal and fracture of the fifth proximal phalanx.  Radiology report reviewed X-ray left hand interpreted by me, shows fracture of the left fifth middle phalanx.  Radiology report reviewed  CT head/maxillofacial/cervical spine unremarkable   PROCEDURES:  .Ortho Injury Treatment  Date/Time: 09/06/2023 8:09 PM  Performed by: Sharman Cheek, MD Authorized by: Sharman Cheek, MD   Consent:    Consent obtained:  Verbal   Consent given by:  Patient   Risks discussed:  Fracture and stiffness   Alternatives discussed:  No treatmentInjury location: finger Location details: left little finger Injury type: fracture Fracture type: middle phalanx MCP joint involved: no IP joint involved: no Pre-procedure neurovascular assessment: neurovascularly intact Pre-procedure distal perfusion: normal Pre-procedure neurological function: normal Pre-procedure range of motion: normal  Anesthesia: Local anesthesia used: no  Patient sedated: NoManipulation performed: no Immobilization: splint Splint type: static finger Splint Applied by: ED Nurse Supplies used: aluminum splint  Post-procedure neurovascular assessment: post-procedure neurovascularly intact Post-procedure distal perfusion: normal Post-procedure neurological function: normal Comments:      .Ortho Injury Treatment  Date/Time: 09/06/2023 8:10 PM  Performed by: Sharman Cheek, MD Authorized by: Sharman Cheek, MD   Consent:    Consent obtained:  Verbal   Consent given by:  Patient   Risks discussed:  Stiffness and restricted joint movement   Alternatives discussed:  No treatmentInjury location: hand Location details: right hand Injury type: fracture Fracture type: fifth metacarpal Pre-procedure neurovascular assessment: neurovascularly intact Pre-procedure distal perfusion: normal Pre-procedure neurological  function: normal Pre-procedure range of motion: reduced  Anesthesia: Local anesthesia used: no  Patient sedated: NoManipulation performed: no Immobilization: splint Splint type: ulnar gutter Splint Applied by: ED Nurse Supplies used: cotton padding, elastic bandage and Ortho-Glass Post-procedure neurovascular assessment: post-procedure neurovascularly intact Post-procedure distal perfusion: normal Post-procedure neurological function: normal Post-procedure range of motion: unchanged      MEDICATIONS ORDERED IN ED: Medications  traMADol (ULTRAM) tablet 50 mg (50 mg Oral Given 09/06/23 1932)     IMPRESSION / MDM / ASSESSMENT AND PLAN / ED COURSE  I reviewed the triage vital signs and the nursing notes.                              Differential diagnosis includes, but is not limited to, intracranial hemorrhage, facial fracture, cervical spine fracture, left hand fracture, right hand fracture  Patient presents with mechanical fall with blunt trauma to right side of the face and bruising on both ulnar hands.  Imaging reveals fractures of the left fifth finger bilaterally.  Left fifth finger stabilized with aluminum foam finger splint.  Right metacarpal and fifth finger stabilized with ulnar gutter splint.  Will refer to orthopedic follow-up.  Tramadol for pain control, already on meloxicam.     FINAL CLINICAL IMPRESSION(S) / ED DIAGNOSES   Final diagnoses:  Closed nondisplaced fracture of middle phalanx of left little finger, initial encounter  Closed displaced fracture of proximal phalanx of right little finger, initial encounter     Rx / DC Orders   ED Discharge Orders          Ordered    traMADol (ULTRAM) 50 MG tablet  Every 6 hours PRN        09/06/23 1952             Note:  This document was prepared using Dragon voice recognition software and may include unintentional dictation errors.   Sharman Cheek, MD 09/06/23 2011

## 2023-09-06 NOTE — ED Triage Notes (Signed)
Pt presents from home for fall at bank this AM. Tripped while stepping up onto a curb. Mechanical fall.  Injury to hands, R forehead (hematoma) Not on a blood thinner.

## 2023-09-11 ENCOUNTER — Encounter: Payer: Self-pay | Admitting: Orthopedic Surgery

## 2023-09-11 ENCOUNTER — Other Ambulatory Visit: Payer: Self-pay | Admitting: Orthopedic Surgery

## 2023-09-11 NOTE — Anesthesia Preprocedure Evaluation (Addendum)
Anesthesia Evaluation  Patient identified by MRN, date of birth, ID band Patient awake    Reviewed: Allergy & Precautions, H&P , NPO status , Patient's Chart, lab work & pertinent test results  History of Anesthesia Complications (+) history of anesthetic complications  Airway Mallampati: II  TM Distance: >3 FB Neck ROM: Full   Comment: Known difficult airway, plan RSI, videolaryngoscope.  Dental no notable dental hx. (+) Caps Cap left upper central incisor:   Pulmonary neg pulmonary ROS   Pulmonary exam normal breath sounds clear to auscultation       Cardiovascular negative cardio ROS Normal cardiovascular exam Rhythm:Regular Rate:Normal     Neuro/Psych  PSYCHIATRIC DISORDERS  Depression    negative neurological ROS  negative psych ROS   GI/Hepatic negative GI ROS, Neg liver ROS,GERD  ,,Severe GERD, controlled with omeprazole, took omeprazole today.    Endo/Other  negative endocrine ROS    Renal/GU negative Renal ROS  negative genitourinary   Musculoskeletal negative musculoskeletal ROS (+) Arthritis ,    Abdominal   Peds negative pediatric ROS (+)  Hematology negative hematology ROS (+)   Anesthesia Other Findings GERD (gastroesophageal reflux disease) Arthritis Depression  Hypercholesteremia Spinal stenosis  Right knee pain Actinic keratosis   Complication of anesthesia--sore throat/cough after wrist surgery. She states she was told that there was difficulty with intubation. Has short TMD and overbite. Severe GERD Plan RSI, videolaryngoscope   Reproductive/Obstetrics negative OB ROS                             Anesthesia Physical Anesthesia Plan  ASA: 2  Anesthesia Plan: General   Post-op Pain Management: Regional block   Induction: Intravenous  PONV Risk Score and Plan:   Airway Management Planned: LMA  Additional Equipment:   Intra-op Plan:   Post-operative  Plan: Extubation in OR  Informed Consent: I have reviewed the patients History and Physical, chart, labs and discussed the procedure including the risks, benefits and alternatives for the proposed anesthesia with the patient or authorized representative who has indicated his/her understanding and acceptance.     Dental Advisory Given  Plan Discussed with: Anesthesiologist, CRNA and Surgeon  Anesthesia Plan Comments: (Patient consented for risks of anesthesia including but not limited to:  - adverse reactions to medications - damage to eyes, teeth, lips or other oral mucosa - nerve damage due to positioning  - sore throat or hoarseness - Damage to heart, brain, nerves, lungs, other parts of body or loss of life  Patient voiced understanding and assent.)        Anesthesia Quick Evaluation

## 2023-09-14 ENCOUNTER — Ambulatory Visit: Payer: Self-pay

## 2023-09-14 ENCOUNTER — Other Ambulatory Visit: Payer: Self-pay

## 2023-09-14 ENCOUNTER — Encounter
Admission: RE | Disposition: A | Discharge status: Home / Self Care | Payer: Self-pay | Source: Home / Self Care | Attending: Orthopedic Surgery

## 2023-09-14 ENCOUNTER — Ambulatory Visit: Payer: Medicare Other | Admitting: Anesthesiology

## 2023-09-14 ENCOUNTER — Encounter: Payer: Self-pay | Admitting: Orthopedic Surgery

## 2023-09-14 ENCOUNTER — Ambulatory Visit
Admission: RE | Admit: 2023-09-14 | Discharge: 2023-09-14 | Disposition: A | Discharge status: Home / Self Care | Payer: Medicare Other | Attending: Orthopedic Surgery | Admitting: Orthopedic Surgery

## 2023-09-14 DIAGNOSIS — F32A Depression, unspecified: Secondary | ICD-10-CM | POA: Diagnosis not present

## 2023-09-14 DIAGNOSIS — M19041 Primary osteoarthritis, right hand: Secondary | ICD-10-CM | POA: Diagnosis not present

## 2023-09-14 DIAGNOSIS — Y92480 Sidewalk as the place of occurrence of the external cause: Secondary | ICD-10-CM | POA: Diagnosis not present

## 2023-09-14 DIAGNOSIS — S62619A Displaced fracture of proximal phalanx of unspecified finger, initial encounter for closed fracture: Secondary | ICD-10-CM | POA: Insufficient documentation

## 2023-09-14 DIAGNOSIS — W19XXXA Unspecified fall, initial encounter: Secondary | ICD-10-CM | POA: Diagnosis not present

## 2023-09-14 DIAGNOSIS — Z79899 Other long term (current) drug therapy: Secondary | ICD-10-CM | POA: Diagnosis not present

## 2023-09-14 DIAGNOSIS — S62627A Displaced fracture of medial phalanx of left little finger, initial encounter for closed fracture: Secondary | ICD-10-CM | POA: Diagnosis not present

## 2023-09-14 DIAGNOSIS — K219 Gastro-esophageal reflux disease without esophagitis: Secondary | ICD-10-CM | POA: Insufficient documentation

## 2023-09-14 HISTORY — PX: CLOSED REDUCTION METACARPAL WITH PERCUTANEOUS PINNING: SHX5613

## 2023-09-14 HISTORY — DX: Other complications of anesthesia, initial encounter: T88.59XA

## 2023-09-14 HISTORY — PX: FINGER CLOSED REDUCTION: SHX1633

## 2023-09-14 HISTORY — DX: Other specified personal risk factors, not elsewhere classified: Z91.89

## 2023-09-14 SURGERY — CLOSED REDUCTION, FRACTURE, METACARPAL BONE, WITH PERCUTANEOUS PINNING
Anesthesia: General | Site: Finger | Laterality: Right

## 2023-09-14 MED ORDER — LIDOCAINE HCL (CARDIAC) PF 100 MG/5ML IV SOSY
PREFILLED_SYRINGE | INTRAVENOUS | Status: DC | PRN
  Administered 2023-09-14: 60 mg via INTRAVENOUS

## 2023-09-14 MED ORDER — CEFAZOLIN SODIUM-DEXTROSE 2-3 GM-%(50ML) IV SOLR
INTRAVENOUS | Status: AC
  Filled 2023-09-14: qty 50

## 2023-09-14 MED ORDER — SODIUM CHLORIDE 0.9% FLUSH: 3.0000 mL | Freq: Two times a day (BID) | INTRAVENOUS | Status: DC

## 2023-09-14 MED ORDER — PROPOFOL 10 MG/ML IV BOLUS
INTRAVENOUS | Status: AC
  Filled 2023-09-14: qty 20

## 2023-09-14 MED ORDER — CEFAZOLIN SODIUM-DEXTROSE 2-4 GM/100ML-% IV SOLN
2.0000 g | INTRAVENOUS | Status: AC
Start: 2023-09-14 — End: 2023-09-14
  Administered 2023-09-14: 2 g via INTRAVENOUS

## 2023-09-14 MED ORDER — DEXAMETHASONE SODIUM PHOSPHATE 4 MG/ML IJ SOLN
INTRAMUSCULAR | Status: AC
  Filled 2023-09-14: qty 1

## 2023-09-14 MED ORDER — FENTANYL CITRATE (PF) 100 MCG/2ML IJ SOLN
INTRAMUSCULAR | Status: DC | PRN
  Administered 2023-09-14 (×2): 25 ug via INTRAVENOUS

## 2023-09-14 MED ORDER — SUCCINYLCHOLINE CHLORIDE 200 MG/10ML IV SOSY
PREFILLED_SYRINGE | INTRAVENOUS | Status: DC | PRN
  Administered 2023-09-14: 100 mg via INTRAVENOUS

## 2023-09-14 MED ORDER — ROCURONIUM BROMIDE 100 MG/10ML IV SOLN
INTRAVENOUS | Status: DC | PRN
  Administered 2023-09-14: 10 mg via INTRAVENOUS

## 2023-09-14 MED ORDER — BUPIVACAINE HCL (PF) 0.5 % IJ SOLN
INTRAMUSCULAR | Status: DC | PRN
  Administered 2023-09-14: 10 mL

## 2023-09-14 MED ORDER — SODIUM CHLORIDE 0.9 % IV SOLN: INTRAVENOUS | Status: DC | PRN

## 2023-09-14 MED ORDER — FENTANYL CITRATE (PF) 100 MCG/2ML IJ SOLN
INTRAMUSCULAR | Status: AC
  Filled 2023-09-14: qty 2

## 2023-09-14 MED ORDER — SUGAMMADEX SODIUM 200 MG/2ML IV SOLN
INTRAVENOUS | Status: DC | PRN
  Administered 2023-09-14: 126.4 mg via INTRAVENOUS

## 2023-09-14 MED ORDER — ACETAMINOPHEN 10 MG/ML IV SOLN
INTRAVENOUS | Status: DC | PRN
  Administered 2023-09-14: 1000 mg via INTRAVENOUS

## 2023-09-14 MED ORDER — ONDANSETRON HCL 4 MG/2ML IJ SOLN
INTRAMUSCULAR | Status: DC | PRN
  Administered 2023-09-14: 4 mg via INTRAVENOUS

## 2023-09-14 MED ORDER — HYDROCODONE-ACETAMINOPHEN 5-325 MG PO TABS: 1.0000 | ORAL_TABLET | ORAL | 0 refills | Status: AC | PRN

## 2023-09-14 MED ORDER — LIDOCAINE HCL (PF) 2 % IJ SOLN
INTRAMUSCULAR | Status: AC
  Filled 2023-09-14: qty 5

## 2023-09-14 MED ORDER — PROPOFOL 10 MG/ML IV BOLUS
INTRAVENOUS | Status: DC | PRN
  Administered 2023-09-14: 120 mg via INTRAVENOUS

## 2023-09-14 MED ORDER — ONDANSETRON HCL 4 MG/2ML IJ SOLN
INTRAMUSCULAR | Status: AC
  Filled 2023-09-14: qty 2

## 2023-09-14 MED ORDER — SODIUM CHLORIDE 0.9% FLUSH
3.0000 mL | INTRAVENOUS | Status: DC | PRN
Start: 2023-09-14 — End: 2023-09-14

## 2023-09-14 MED ORDER — DEXAMETHASONE SODIUM PHOSPHATE 4 MG/ML IJ SOLN
INTRAMUSCULAR | Status: DC | PRN
  Administered 2023-09-14: 8 mg via INTRAVENOUS

## 2023-09-14 MED ORDER — ACETAMINOPHEN 10 MG/ML IV SOLN
INTRAVENOUS | Status: AC
  Filled 2023-09-14: qty 100

## 2023-09-14 SURGICAL SUPPLY — 17 items
BANDAGE GAUZE 1X75IN STRL (MISCELLANEOUS) ×2 IMPLANT
BNDG GAUZE 1X75IN STRL (MISCELLANEOUS) ×2
CHLORAPREP W/TINT 26 (MISCELLANEOUS) IMPLANT
DRAPE FLUOR MINI C-ARM 54X84 (DRAPES) ×2 IMPLANT
GAUZE XEROFORM 1X8 LF (GAUZE/BANDAGES/DRESSINGS) ×2 IMPLANT
GLOVE SURG SYN 9.0 PF PI (GLOVE) ×2 IMPLANT
GOWN STRL REIN 2XL XLG LVL4 (GOWN DISPOSABLE) ×2 IMPLANT
GOWN STRL REUS W/ TWL LRG LVL3 (GOWN DISPOSABLE) ×2 IMPLANT
K-WIRE DBL END TROCAR 6X.045 (WIRE) ×2
KIT TURNOVER KIT A (KITS) ×2 IMPLANT
KWIRE DBL END TROCAR 6X.045 (WIRE) IMPLANT
MANIFOLD NEPTUNE II (INSTRUMENTS) ×2 IMPLANT
NS IRRIG 500ML POUR BTL (IV SOLUTION) ×2 IMPLANT
PACK EXTREMITY ARMC (MISCELLANEOUS) ×2 IMPLANT
PAD PREP 24X41 OB/GYN DISP (PERSONAL CARE ITEMS) ×2 IMPLANT
PIN BALLS 3/8 F/.045 WIRE (MISCELLANEOUS) IMPLANT
SPLINT CAST 1 STEP 4X30 (MISCELLANEOUS) ×2 IMPLANT

## 2023-09-14 NOTE — Anesthesia Procedure Notes (Addendum)
Procedure Name: Intubation Date/Time: 09/14/2023 12:47 PM  Performed by: Trella Thurmond, [place], CRNAPre-anesthesia Checklist: Patient identified, Patient being monitored, Timeout performed, Emergency Drugs available and Suction available Patient Re-evaluated:Patient Re-evaluated prior to induction Oxygen Delivery Method: Circle system utilized Preoxygenation: Pre-oxygenation with 100% oxygen Induction Type: IV induction, Cricoid Pressure applied and Rapid sequence Laryngoscope Size: McGrath and 4 Grade View: Grade II Tube type: Oral Tube size: 6.5 mm Number of attempts: 1 Airway Equipment and Method: Stylet Placement Confirmation: ETT inserted through vocal cords under direct vision, positive ETCO2 and breath sounds checked- equal and bilateral Secured at: 21 cm Tube secured with: Tape Dental Injury: Teeth and Oropharynx as per pre-operative assessment  Comments: History of difficult airway with poor controlled GERD at times therefore RSI with McGrath 4, 2A view

## 2023-09-14 NOTE — Transfer of Care (Signed)
Immediate Anesthesia Transfer of Care Note  Patient: Skylarrose Heringer  Procedure(s) Performed: Closed reduction, percutaneous pinning of left small phalanx fracture and Closed reduction of right fifth proximal phalanx, no pinning, just buddy tape (Left: Finger) Closed reduction of right fifth proximal phalanx, no pinning, just buddy tape (Right: Finger)  Patient Location: PACU  Anesthesia Type: General  Level of Consciousness: awake, alert  and patient cooperative  Airway and Oxygen Therapy: Patient Spontanous Breathing and Patient connected to supplemental oxygen  Post-op Assessment: Post-op Vital signs reviewed, Patient's Cardiovascular Status Stable, Respiratory Function Stable, Patent Airway and No signs of Nausea or vomiting  Post-op Vital Signs: Reviewed and stable  Complications: No notable events documented.

## 2023-09-14 NOTE — Anesthesia Postprocedure Evaluation (Signed)
Anesthesia Post Note  Patient: Tara Griffin  Procedure(s) Performed: Closed reduction, percutaneous pinning of left small phalanx fracture and Closed reduction of right fifth proximal phalanx, no pinning, just buddy tape (Left: Finger) Closed reduction of right fifth proximal phalanx, no pinning, just buddy tape (Right: Finger)  Anesthesia Type: General Anesthetic complications: no Comments: Hx previous difficult intubation, used McGrath videolaryngoscope with 4 blade, grade 2a view, easy intubation.  Patient happy postop.  She does not presently have sore throat, but discussed that this occurs in about 50% of intubations, so if she does develop sore throat, she can gargle with warm salt water or use zinc throat lozenges to decrease the duration and intensity of any sore throat.  She said she "bit her lip" and she has small place on left upper lip but is doing well.  She expressed that she is very happy with her anesthesia care and her entire experience here at [facility].    No notable events documented.   Last Vitals:  Vitals:   09/14/23 1252 09/14/23 1315  BP: (!) 154/76 (!) 166/74  Pulse: 79 81  Resp: 15 16  Temp: (!) 36.4 C   SpO2: 98% 99%    Last Pain:  Vitals:   09/14/23 1315  TempSrc:   PainSc: 0-No pain                 Tara Griffin C Shannan Slinker

## 2023-09-14 NOTE — Discharge Instructions (Signed)
Keep hands clean and dry is much as possible, use hand sanitizer if you need to clean them Leave tape on until recheck Be careful not to have pin pull out of left little finger Pain medicine as directed Call if you start developing anything that looks like an infection in the left little finger, [phone]

## 2023-09-14 NOTE — Op Note (Signed)
09/14/2023  12:44 PM  PATIENT:  Tara Griffin  82 y.o. female  PRE-OPERATIVE DIAGNOSIS:  Closed displaced fracture of proximal phalanx of finger of right hand S62.619A Closed displaced fracture of middle phalanx of left little finger, initial encounter S62.627A  POST-OPERATIVE DIAGNOSIS:  Closed displaced fracture of proximal phalanx of finger of right hand, Closed displaced fracture of middle phalanx of left little finger, initial encounter  PROCEDURE:  Procedure(s): Closed reduction, percutaneous pinning of left small phalanx fracture and Closed reduction of right fifth proximal phalanx, no pinning, just buddy tape (Left) Closed reduction of right fifth proximal phalanx, no pinning, just buddy tape (Right)  SURGEON: Leitha Schuller, MD  ASSISTANTS:  none  ANESTHESIA:   general  EBL:  Total I/O In: 350 [I.V.:250; IV Piggyback:100] Out: -   BLOOD ADMINISTERED:none  DRAINS: none   LOCAL MEDICATIONS USED:  MARCAINE    and Amount: 10 ml  SPECIMEN:  No Specimen  DISPOSITION OF SPECIMEN:  N/A  COUNTS:  YES  TOURNIQUET:  * No tourniquets in log *  IMPLANTS: 0.45 K wire x 1  DICTATION: .Dragon Dictation patient was brought to the operating room and after general endotracheal anesthesia was obtained appropriate patient identification timeout procedures were completed.  The right little finger proximal phalanx fracture was then reduced with mini fluoroscopy for evaluation using the pencil technique with the pencil placed at the base of the right fifth finger bringing the little finger across near anatomic alignment was obtained on AP and lateral imaging with the mini C arm.  The finger was then buddy taped to the ring finger.  Then the left hand was examined under fluoroscopy and acceptable close reduction was obtained but was very unstable the arm was then prepped and draped in the usual sterile fashion with a K wire inserted through the distal phalanx and with the finger held in the  reduced position the pin was placed across the DIP joint through the distal fragment into the base of the middle phalanx.  Anatomic alignment on both AP and lateral projections.  A Jurgens ball was then applied over the wire the wire cut and then the Jurgens ball pulled back to prevent the tip of the wire from protruding.  The finger was then buddy taped to the ring finger to allow for motion and to protect it from abnormal stresses.  10 cc of half percent Sensorcaine were infiltrated as a digital block to aid in postop analgesia of the little finger.  PLAN OF CARE: Discharge to home after PACU  PATIENT DISPOSITION:  PACU - hemodynamically stable.

## 2023-09-14 NOTE — H&P (Signed)
Chief Complaint Patient presents with Left Hand - Pain Right Hand - Pain   History of the Present Illness: Tara Griffin is a 82 y.o. female here today.  The patient is an 82 year old female who comes in for an ER follow-up, first time for this problem. She was seen several years ago for a wrist fracture with ORIF. She had a completely displaced finger fracture and comes in to discuss treatment options, probable operative intervention. She is accompanied by an adult female.  She recounts an incident where she fell while stepping up on the sidewalk while moving from her car. She has been told by her family that she shuffles when she walks.  She is experiencing widespread pain, particularly in her right thumb joint. She had throbbing pain yesterday.  She had both her cataracts removed at [facility].  I have reviewed past medical, surgical, social and family history, and allergies as documented in the EMR.  Past Medical History: Past Medical History: Diagnosis Date Arthritis GERD (gastroesophageal reflux disease) Hyperlipidemia Osteopenia of multiple sites 10/13/2016 By DEXA 1/18 Vitamin D deficiency 10/03/2016  Past Surgical History: Past Surgical History: Procedure Laterality Date ORIF DISTAL RADIUS FRACTURE Left 02/26/2020 Dr. Rosita Kea HYSTERECTOMY  Past Family History: Family History Problem Relation Age of Onset Pancreatic cancer Mother Leukemia Father  Medications: Current Outpatient Medications Medication Sig Dispense Refill aspirin 81 MG EC tablet Take 81 mg by mouth once daily. azelastine (ASTELIN) 137 mcg nasal spray Place 1 spray into both nostrils 2 (two) times daily 30 mL 11 calcium carb/magnesium ox,carb (CAL-MAG ORAL) Take by mouth Citrate twice a day cholecalciferol (VITAMIN D3) 1000 unit tablet Take by mouth escitalopram oxalate (LEXAPRO) 10 MG tablet Take 1 tablet (10 mg total) by mouth once daily 90 tablet 3 gabapentin (NEURONTIN) 100 MG capsule TAKE 1  CAPSULE(100 MG) BY MOUTH THREE TIMES DAILY 90 capsule 3 meloxicam (MOBIC) 15 MG tablet Take 1 tablet (15 mg total) by mouth once daily 90 tablet 1 MULTIVIT-MINERALS/FERROUS FUM (MULTI VITAMIN ORAL) Take by mouth. omeprazole (PRILOSEC) 40 MG DR capsule Take 1 capsule (40 mg total) by mouth once daily 90 capsule 1 simvastatin (ZOCOR) 20 MG tablet Take 1 tablet (20 mg total) by mouth at bedtime 90 tablet 3  No current facility-administered medications for this visit.  Allergies: No Known Allergies  Body mass index is 25.14 kg/m.  Review of Systems: A comprehensive 14 point ROS was performed, reviewed, and the pertinent orthopaedic findings are documented in the HPI.  Vitals: 09/10/23 1041 BP: 122/86   General Physical Examination:  HEENT examination is normal. Lungs are clear. Heart has a regular rate and rhythm.  Musculoskeletal: Left finger angulation with fracture. Right thumb joint pain.  Radiographs:  Right base of 5th proximal phalanx fracture, displaced. Left little finger middle phalanx fracture, completely displaced.  Assessment: ICD-10-CM 1. Closed displaced fracture of proximal phalanx of finger of right hand S62.619A 2. Closed displaced fracture of middle phalanx of left little finger, initial encounter M01.027O  Plan:  The patient has clinical findings of:  1. Right base of 5th proximal phalanx fracture, displaced. The fracture is completely displaced. A closed reduction using the pencil technique and buddy taping will be performed. Surgery is scheduled for Friday.  2. Left little finger distal middle phalanx fracture. The fracture is completely displaced with 90 degrees angulation. A closed reduction will be attempted, and if necessary, an open reduction with pinning across the joint through the tip will be performed. The pin will remain in place  for 3 weeks to allow for healing, followed by an x-ray and removal of the pin.  3. Arthritis. The patient  reports severe pain in the right thumb joint due to arthritis. She is advised to manage the pain with over-the-counter pain medication as needed.  Surgical Risks:  The nature of the condition and the proposed procedure has been reviewed in detail with the patient. Surgical versus non-surgical options and prognosis for recovery have been reviewed and the inherent risks and benefits of each have been discussed including the risks of infection, bleeding, injury to nerves/blood vessels/tendons, incomplete relief of symptoms, persisting pain and/or stiffness, loss of function, complex regional pain syndrome, failure of the procedure, as appropriate.  Document Attestation: I, Dawn Royse, have reviewed and updated documentation for [facility], MD, utilizing [employer] DAX.   Electronically signed by Marlena Clipper, MD at 09/11/2023 2:08 PM EST  Reviewed  H+P. No changes noted.

## 2023-09-15 ENCOUNTER — Encounter: Payer: Self-pay | Admitting: Orthopedic Surgery

## 2023-12-28 ENCOUNTER — Emergency Department
Admission: EM | Admit: 2023-12-28 | Discharge: 2023-12-28 | Disposition: A | Discharge status: Home / Self Care | Attending: Emergency Medicine | Admitting: Emergency Medicine

## 2023-12-28 ENCOUNTER — Other Ambulatory Visit: Payer: Self-pay

## 2023-12-28 ENCOUNTER — Emergency Department

## 2023-12-28 ENCOUNTER — Encounter: Payer: Self-pay | Admitting: Emergency Medicine

## 2023-12-28 DIAGNOSIS — R051 Acute cough: Secondary | ICD-10-CM | POA: Insufficient documentation

## 2023-12-28 DIAGNOSIS — R059 Cough, unspecified: Secondary | ICD-10-CM | POA: Diagnosis present

## 2023-12-28 DIAGNOSIS — J40 Bronchitis, not specified as acute or chronic: Secondary | ICD-10-CM | POA: Insufficient documentation

## 2023-12-28 MED ORDER — ALBUTEROL SULFATE HFA 108 (90 BASE) MCG/ACT IN AERS: 2.0000 | INHALATION_SPRAY | Freq: Four times a day (QID) | RESPIRATORY_TRACT | 0 refills | Status: AC | PRN

## 2023-12-28 MED ORDER — HYDROCOD POLI-CHLORPHE POLI ER 10-8 MG/5ML PO SUER
5.0000 mL | Freq: Once | ORAL | Status: AC
  Administered 2023-12-28: 5 mL via ORAL
  Filled 2023-12-28: qty 5

## 2023-12-28 MED ORDER — PREDNISONE 20 MG PO TABS: 40.0000 mg | ORAL_TABLET | Freq: Every day | ORAL | 0 refills | Status: AC

## 2023-12-28 MED ORDER — HYDROCOD POLI-CHLORPHE POLI ER 10-8 MG/5ML PO SUER: 5.0000 mL | Freq: Two times a day (BID) | ORAL | 0 refills | Status: AC | PRN

## 2023-12-28 MED ORDER — IPRATROPIUM-ALBUTEROL 0.5-2.5 (3) MG/3ML IN SOLN
3.0000 mL | Freq: Once | RESPIRATORY_TRACT | Status: AC
Start: 2023-12-28 — End: 2023-12-28
  Administered 2023-12-28: 3 mL via RESPIRATORY_TRACT
  Filled 2023-12-28: qty 3

## 2023-12-28 MED ORDER — PREDNISONE 20 MG PO TABS
40.0000 mg | ORAL_TABLET | Freq: Once | ORAL | Status: AC
  Administered 2023-12-28: 40 mg via ORAL
  Filled 2023-12-28: qty 2

## 2023-12-28 NOTE — Discharge Instructions (Addendum)
 Prednisone steroids once daily for 4 more days to finish total of 5 days (we gave you 1 dose in the ED)  Albuterol inhaler to help with any wheezing.  Use as needed at home.  1-2 puffs as needed every 4-6 hours.  Tussionex cough syrup to use 1-2 times per day as needed for cough.  Most helpful at night before sleep, but can also take once in the daytime as well

## 2023-12-28 NOTE — ED Triage Notes (Signed)
 C?O head cold since Monday night.  Temp on Tuesday 98.9.  COVID test was negative on Tuesday.  Yesterday symptoms worsened to cough, cough worse with laying down. C/O wheezing with exhale. Cough is non productive.    Presented to [facility], referred patient ot ED due to irregular heart rate at [facility]. Per report HR running 56-160.  Patient took Nyquil last night.  AAOx3.  Skin warm and dry. NAD

## 2023-12-28 NOTE — ED Provider Notes (Signed)
 [facility] Provider Note    Event Date/Time   First MD Initiated Contact with Patient 12/28/23 1007     (approximate)   History   Cough   HPI  Tara Griffin is a 83 y.o. female who presents to the ED for evaluation of Cough   Review of PCP visit from January.  History of HLD, GERD, former tobacco user.  Patient presents to the ED alongside her son for evaluation of a cough that has been present for the past 3 to 4 days, minimally productive.  No fevers, pain, dyspnea.  Reports the cough is quite bothersome, making sleep difficult.   Physical Exam   Triage Vital Signs: ED Triage Vitals  Encounter Vitals Group     BP 12/28/23 0949 (!) 126/91     Systolic BP Percentile --      Diastolic BP Percentile --      Pulse Rate 12/28/23 0949 87     Resp 12/28/23 0949 18     Temp 12/28/23 0949 98.7 F (37.1 C)     Temp Source 12/28/23 0949 Oral     SpO2 12/28/23 0949 94 %     Weight 12/28/23 1009 139 lb 5.3 oz (63.2 kg)     Height 12/28/23 1009 5\' 3"  (1.6 m)     Head Circumference --      Peak Flow --      Pain Score --      Pain Loc --      Pain Education --      Exclude from Growth Chart --     Most recent vital signs: Vitals:   12/28/23 0949  BP: (!) 126/91  Pulse: 87  Resp: 18  Temp: 98.7 F (37.1 C)  SpO2: 94%    General: Awake, no distress.  CV:  Good peripheral perfusion.  Resp:  Normal effort.  Faint and scattered expiratory wheezes but good airflow throughout. Abd:  No distention.  MSK:  No deformity noted.  Neuro:  No focal deficits appreciated. Other:     ED Results / Procedures / Treatments   Labs (all labs ordered are listed, but only abnormal results are displayed) Labs Reviewed - No data to display  EKG Sinus rhythm with a couple PVCs with a rate of 88 bpm low amplitude, incomplete right bundle, no clear signs of acute ischemia.  Nonspecific changes.  Very similar to comparison from 2021  RADIOLOGY CXR  interpreted by me without evidence of acute cardiopulmonary pathology.  Official radiology report(s): DG Chest 2 View Result Date: 12/28/2023 CLINICAL DATA:  Cough with wheezing. EXAM: CHEST - 2 VIEW COMPARISON:  None Available. FINDINGS: The lungs are clear without focal pneumonia, edema, pneumothorax or pleural effusion. Cardiopericardial silhouette is at upper limits of normal for size. Nodular density/densities projecting over the lungs are compatible with pads for telemetry leads. No acute bony abnormality. IMPRESSION: No active cardiopulmonary disease. Electronically Signed   By: Kennith Center M.D.   On: 12/28/2023 11:04    PROCEDURES and INTERVENTIONS:  Procedures  Medications  ipratropium-albuterol (DUONEB) 0.5-2.5 (3) MG/3ML nebulizer solution 3 mL (3 mLs Nebulization Given 12/28/23 1111)  predniSONE (DELTASONE) tablet 40 mg (40 mg Oral Given 12/28/23 1111)  chlorpheniramine-HYDROcodone (TUSSIONEX) 10-8 MG/5ML suspension 5 mL (5 mLs Oral Given 12/28/23 1110)     IMPRESSION / MDM / ASSESSMENT AND PLAN / ED COURSE  I reviewed the triage vital signs and the nursing notes.  Differential diagnosis includes, but is not limited  to, bronchitis, pneumonia, pneumothorax, ACS  {Patient presents with symptoms of an acute illness or injury that is potentially life-threatening.  Patient presents with a few days of a cough likely representing bronchitis.  Does have some mild scattered wheezing on exam that would likely benefit from a steroid burst and a breathing treatment.  Will provide antitussives and reassess.  CXR is clear without evidence of infiltration.  No indications for antibiotics at this point.  Clinical Course as of 12/28/23 1230  Fri Dec 28, 2023  1226 Reassessed.  Feeling much better and is appreciative.  We discussed bronchitis, management at home, ED return precautions and answered her questions. [DS]    Clinical Course User Index [DS] Delton Prairie, MD     FINAL CLINICAL  IMPRESSION(S) / ED DIAGNOSES   Final diagnoses:  Acute cough  Bronchitis     Rx / DC Orders   ED Discharge Orders          Ordered    predniSONE (DELTASONE) 20 MG tablet  Daily with breakfast        12/28/23 1228    albuterol (VENTOLIN HFA) 108 (90 Base) MCG/ACT inhaler  Every 6 hours PRN        12/28/23 1228    chlorpheniramine-HYDROcodone (TUSSIONEX) 10-8 MG/5ML  Every 12 hours PRN        12/28/23 1228             Note:  This document was prepared using Dragon voice recognition software and may include unintentional dictation errors.   Delton Prairie, MD 12/28/23 [phone]

## 2024-07-07 ENCOUNTER — Ambulatory Visit: Payer: Medicare Other | Admitting: Dermatology
